# Patient Record
Sex: Male | Born: 1965 | Race: White | Hispanic: No | Smoking: Current every day smoker
Health system: Southern US, Community
[De-identification: ages and names within clinical notes are randomized; demographics above are authoritative.]

## PROBLEM LIST (undated history)

## (undated) DIAGNOSIS — I219 Acute myocardial infarction, unspecified: Secondary | ICD-10-CM

## (undated) DIAGNOSIS — Z789 Other specified health status: Secondary | ICD-10-CM

## (undated) DIAGNOSIS — L0390 Cellulitis, unspecified: Secondary | ICD-10-CM

## (undated) DIAGNOSIS — I10 Essential (primary) hypertension: Secondary | ICD-10-CM

## (undated) HISTORY — PX: CORONARY ARTERY BYPASS GRAFT: SHX141

## (undated) HISTORY — PX: APPENDECTOMY: SHX54

---

## 2004-10-08 ENCOUNTER — Emergency Department (HOSPITAL_COMMUNITY): Admission: EM | Admit: 2004-10-08 | Discharge: 2004-10-09 | Payer: Self-pay

## 2004-10-10 ENCOUNTER — Emergency Department (HOSPITAL_COMMUNITY): Admission: EM | Admit: 2004-10-10 | Discharge: 2004-10-10 | Payer: Self-pay | Admitting: Emergency Medicine

## 2004-10-13 ENCOUNTER — Emergency Department (HOSPITAL_COMMUNITY): Admission: EM | Admit: 2004-10-13 | Discharge: 2004-10-13 | Payer: Self-pay | Admitting: Emergency Medicine

## 2004-11-22 ENCOUNTER — Emergency Department (HOSPITAL_COMMUNITY): Admission: EM | Admit: 2004-11-22 | Discharge: 2004-11-22 | Payer: Self-pay | Admitting: Emergency Medicine

## 2006-06-27 ENCOUNTER — Emergency Department (HOSPITAL_COMMUNITY): Admission: EM | Admit: 2006-06-27 | Discharge: 2006-06-27 | Payer: Self-pay | Admitting: Emergency Medicine

## 2006-11-04 ENCOUNTER — Emergency Department (HOSPITAL_COMMUNITY): Admission: EM | Admit: 2006-11-04 | Discharge: 2006-11-04 | Payer: Self-pay | Admitting: Emergency Medicine

## 2007-05-29 ENCOUNTER — Emergency Department (HOSPITAL_COMMUNITY): Admission: EM | Admit: 2007-05-29 | Discharge: 2007-05-29 | Payer: Self-pay | Admitting: Emergency Medicine

## 2007-07-08 ENCOUNTER — Emergency Department (HOSPITAL_COMMUNITY): Admission: EM | Admit: 2007-07-08 | Discharge: 2007-07-08 | Payer: Self-pay | Admitting: Hematology and Oncology

## 2017-03-16 ENCOUNTER — Emergency Department (HOSPITAL_COMMUNITY): Payer: Self-pay

## 2017-03-16 ENCOUNTER — Encounter (HOSPITAL_COMMUNITY): Payer: Self-pay | Admitting: Emergency Medicine

## 2017-03-16 ENCOUNTER — Inpatient Hospital Stay (HOSPITAL_COMMUNITY)
Admission: EM | Admit: 2017-03-16 | Discharge: 2017-03-20 | DRG: 728 | Disposition: A | Discharge status: Home / Self Care | Payer: Self-pay | Attending: Internal Medicine | Admitting: Internal Medicine

## 2017-03-16 DIAGNOSIS — F17200 Nicotine dependence, unspecified, uncomplicated: Secondary | ICD-10-CM | POA: Diagnosis present

## 2017-03-16 DIAGNOSIS — N4821 Abscess of corpus cavernosum and penis: Secondary | ICD-10-CM | POA: Diagnosis present

## 2017-03-16 DIAGNOSIS — N4822 Cellulitis of corpus cavernosum and penis: Secondary | ICD-10-CM

## 2017-03-16 DIAGNOSIS — L0390 Cellulitis, unspecified: Secondary | ICD-10-CM | POA: Diagnosis present

## 2017-03-16 DIAGNOSIS — R3912 Poor urinary stream: Secondary | ICD-10-CM

## 2017-03-16 DIAGNOSIS — N433 Hydrocele, unspecified: Secondary | ICD-10-CM | POA: Diagnosis present

## 2017-03-16 DIAGNOSIS — N50819 Testicular pain, unspecified: Secondary | ICD-10-CM

## 2017-03-16 DIAGNOSIS — R531 Weakness: Secondary | ICD-10-CM

## 2017-03-16 LAB — CBC WITH DIFFERENTIAL/PLATELET
BASOS ABS: 0.1 10*3/uL (ref 0.0–0.1)
Basophils Relative: 0 %
EOS PCT: 1 %
Eosinophils Absolute: 0.2 10*3/uL (ref 0.0–0.7)
HCT: 48.2 % (ref 39.0–52.0)
HEMOGLOBIN: 16.7 g/dL (ref 13.0–17.0)
LYMPHS ABS: 2 10*3/uL (ref 0.7–4.0)
LYMPHS PCT: 14 %
MCH: 33.4 pg (ref 26.0–34.0)
MCHC: 34.6 g/dL (ref 30.0–36.0)
MCV: 96.4 fL (ref 78.0–100.0)
Monocytes Absolute: 1.4 10*3/uL — ABNORMAL HIGH (ref 0.1–1.0)
Monocytes Relative: 10 %
NEUTROS PCT: 75 %
Neutro Abs: 10.4 10*3/uL — ABNORMAL HIGH (ref 1.7–7.7)
PLATELETS: 239 10*3/uL (ref 150–400)
RBC: 5 MIL/uL (ref 4.22–5.81)
RDW: 13.9 % (ref 11.5–15.5)
WBC: 14.1 10*3/uL — AB (ref 4.0–10.5)

## 2017-03-16 LAB — URINALYSIS, ROUTINE W REFLEX MICROSCOPIC
Bacteria, UA: NONE SEEN
Bilirubin Urine: NEGATIVE
GLUCOSE, UA: NEGATIVE mg/dL
Ketones, ur: NEGATIVE mg/dL
LEUKOCYTES UA: NEGATIVE
NITRITE: NEGATIVE
PH: 6 (ref 5.0–8.0)
PROTEIN: NEGATIVE mg/dL
SPECIFIC GRAVITY, URINE: 1.003 — AB (ref 1.005–1.030)

## 2017-03-16 LAB — BASIC METABOLIC PANEL
ANION GAP: 7 (ref 5–15)
BUN: 15 mg/dL (ref 6–20)
CHLORIDE: 103 mmol/L (ref 101–111)
CO2: 25 mmol/L (ref 22–32)
Calcium: 9.5 mg/dL (ref 8.9–10.3)
Creatinine, Ser: 0.91 mg/dL (ref 0.61–1.24)
GFR calc Af Amer: >60 mL/min (ref >60)
GLUCOSE: 102 mg/dL — AB (ref 65–99)
POTASSIUM: 4 mmol/L (ref 3.5–5.1)
SODIUM: 135 mmol/L (ref 135–145)

## 2017-03-16 MED ORDER — VANCOMYCIN HCL IN DEXTROSE 1-5 GM/200ML-% IV SOLN
1000.0000 mg | Freq: Once | INTRAVENOUS | Status: AC
  Administered 2017-03-16: 1000 mg via INTRAVENOUS
  Filled 2017-03-16: qty 200

## 2017-03-16 MED ORDER — HYDROCODONE-ACETAMINOPHEN 5-325 MG PO TABS: 1.0000 | ORAL_TABLET | ORAL | Status: DC | PRN

## 2017-03-16 MED ORDER — PIPERACILLIN-TAZOBACTAM 3.375 G IVPB 30 MIN
3.3750 g | Freq: Once | INTRAVENOUS | Status: AC
  Administered 2017-03-16: 3.375 g via INTRAVENOUS
  Filled 2017-03-16: qty 50

## 2017-03-16 MED ORDER — ACETAMINOPHEN 650 MG RE SUPP: 650.0000 mg | Freq: Four times a day (QID) | RECTAL | Status: DC | PRN

## 2017-03-16 MED ORDER — ACETAMINOPHEN 325 MG PO TABS: 650.0000 mg | ORAL_TABLET | Freq: Four times a day (QID) | ORAL | Status: DC | PRN

## 2017-03-16 MED ORDER — ONDANSETRON HCL 4 MG PO TABS: 4.0000 mg | ORAL_TABLET | Freq: Four times a day (QID) | ORAL | Status: DC | PRN

## 2017-03-16 MED ORDER — KETOROLAC TROMETHAMINE 30 MG/ML IJ SOLN
30.0000 mg | Freq: Four times a day (QID) | INTRAMUSCULAR | Status: AC
  Administered 2017-03-16 – 2017-03-18 (×8): 30 mg via INTRAVENOUS
  Filled 2017-03-16 (×7): qty 1

## 2017-03-16 MED ORDER — SODIUM CHLORIDE 0.9 % IV SOLN
INTRAVENOUS | Status: DC
  Administered 2017-03-16 – 2017-03-19 (×4): via INTRAVENOUS

## 2017-03-16 MED ORDER — ONDANSETRON HCL 4 MG/2ML IJ SOLN: 4.0000 mg | Freq: Four times a day (QID) | INTRAMUSCULAR | Status: DC | PRN

## 2017-03-16 MED ORDER — HYDROCODONE-ACETAMINOPHEN 5-325 MG PO TABS
1.0000 | ORAL_TABLET | ORAL | Status: DC | PRN
  Administered 2017-03-16 – 2017-03-19 (×7): 1 via ORAL
  Filled 2017-03-16 (×7): qty 1

## 2017-03-16 MED ORDER — HYDROCODONE-ACETAMINOPHEN 5-325 MG PO TABS
1.0000 | ORAL_TABLET | Freq: Once | ORAL | Status: AC
  Administered 2017-03-16: 1 via ORAL
  Filled 2017-03-16: qty 1

## 2017-03-16 MED ORDER — PIPERACILLIN-TAZOBACTAM 3.375 G IVPB 30 MIN: 3.3750 g | Freq: Once | INTRAVENOUS | Status: DC

## 2017-03-16 MED ORDER — PIPERACILLIN-TAZOBACTAM 3.375 G IVPB
3.3750 g | Freq: Three times a day (TID) | INTRAVENOUS | Status: DC
  Administered 2017-03-16 – 2017-03-20 (×11): 3.375 g via INTRAVENOUS
  Filled 2017-03-16 (×11): qty 50

## 2017-03-16 MED ORDER — PIPERACILLIN-TAZOBACTAM IN DEX 2-0.25 GM/50ML IV SOLN: 2.2500 g | Freq: Once | INTRAVENOUS | Status: DC

## 2017-03-16 MED ORDER — HEPARIN SODIUM (PORCINE) 5000 UNIT/ML IJ SOLN
5000.0000 [IU] | Freq: Three times a day (TID) | INTRAMUSCULAR | Status: DC
  Administered 2017-03-16 – 2017-03-19 (×8): 5000 [IU] via SUBCUTANEOUS
  Filled 2017-03-16 (×10): qty 1

## 2017-03-16 MED ORDER — VANCOMYCIN HCL 10 G IV SOLR
1250.0000 mg | Freq: Two times a day (BID) | INTRAVENOUS | Status: DC
  Administered 2017-03-17 – 2017-03-20 (×7): 1250 mg via INTRAVENOUS
  Filled 2017-03-16 (×13): qty 1250

## 2017-03-16 MED ORDER — NICOTINE 21 MG/24HR TD PT24
21.0000 mg | MEDICATED_PATCH | Freq: Every day | TRANSDERMAL | Status: DC
  Administered 2017-03-16 – 2017-03-19 (×4): 21 mg via TRANSDERMAL
  Filled 2017-03-16 (×4): qty 1

## 2017-03-16 MED ORDER — ZOLPIDEM TARTRATE 5 MG PO TABS
10.0000 mg | ORAL_TABLET | Freq: Every evening | ORAL | Status: DC | PRN
  Administered 2017-03-16 – 2017-03-19 (×3): 10 mg via ORAL
  Filled 2017-03-16 (×4): qty 2

## 2017-03-16 NOTE — ED Triage Notes (Signed)
Pt reports left groin pain ever since helping a friend move furniture out of garage. Pt reports is still able to void. Pt reports testicle swelling and redness. nad noted. Pt denies any fevers.

## 2017-03-16 NOTE — Progress Notes (Signed)
Pharmacy Antibiotic Note  Charles Abbott is a 51 y.o. male admitted on 03/16/2017 with cellulitis.  Pharmacy has been consulted for vancomycin and  dosing. Vancomycin 2gm total ordered and zosyn 3.375 gm given  Plan: Zosyn 3.375g IV q8h (4 hour infusion).  Vancomycin 1250 mg IV q12 hours f/u renal function, cultures and clinical course  Height: 6\' 2"  (188 cm) Weight: 205 lb (93 kg) IBW/kg (Calculated) : 82.2  Temp (24hrs), Avg:98.2 F (36.8 C), Min:97.7 F (36.5 C), Max:98.6 F (37 C)   Recent Labs Lab 03/16/17 1327  WBC 14.1*  CREATININE 0.91    Estimated Creatinine Clearance: 112.9 mL/min (by C-G formula based on SCr of 0.91 mg/dL).    No Known Allergies  Antimicrobials this admission: vanc 3/26 >>  zosyn 3/26 >>    Microbiology results: 3/26 UCx: pending    Thank you for allowing pharmacy to be a part of this patient's care.  Woodfin GanjaSeay, Graylyn Bunney Poteet 03/16/2017 6:52 PM

## 2017-03-16 NOTE — Consult Note (Signed)
Subjective: I was asked to see Mr. Lober in consultation by Dr. Kerry Hough for penile cellulitis.   Mr. 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  He has been doing self dilation but about 2 weeks ago lost his dilator.  He is a little circumspect about the situation and I am not sure if he had a traumatic dilation.  He has had some progressive difficulty voiding and over the last few days has developed progressive painful penile swelling and came to the ER today.  A penile and scrotal US demonstrated findings consistent with penile cellulitis but a phlegmon could not be ruled out.   He has been voiding but the stream is slow.  He has had no fever.  He has no other GU history.   ROS:  Review of Systems  All other systems reviewed and are negative.   No Known Allergies  History reviewed. No pertinent past medical history.  Past Surgical History:  Procedure Laterality Date  . APPENDECTOMY      Social History   Social History  . Marital status: Divorced    Spouse name: N/A  . Number of children: N/A  . Years of education: N/A   Occupational History  . Not on file.   Social History Main Topics  . Smoking status: Current Every Day Smoker    Packs/day: 1.00  . Smokeless tobacco: Never Used  . Alcohol use No  . Drug use: No  . Sexual activity: Not on file   Other Topics Concern  . Not on file   Social History Narrative  . No narrative on file    History reviewed. No pertinent family history.  Anti-infectives: Anti-infectives    Start     Dose/Rate Route Frequency Ordered Stop   03/17/17 0500  vancomycin (VANCOCIN) 1,250 mg in sodium chloride 0.9 % 250 mL IVPB     1,250 mg 166.7 mL/hr over 90 Minutes Intravenous Every 12 hours 03/16/17 1851     03/16/17 2100  piperacillin-tazobactam (ZOSYN) IVPB 3.375 g     3.375 g 12.5 mL/hr over 240 Minutes Intravenous Every 8 hours 03/16/17 1851      03/16/17 1845  piperacillin-tazobactam (ZOSYN) IVPB 3.375 g  Status:  Discontinued     3.375 g 100 mL/hr over 30 Minutes Intravenous  Once 03/16/17 1841 03/16/17 1849   03/16/17 1845  vancomycin (VANCOCIN) IVPB 1000 mg/200 mL premix     1,000 mg 200 mL/hr over 60 Minutes Intravenous  Once 03/16/17 1841 03/16/17 2018   03/16/17 1500  piperacillin-tazobactam (ZOSYN) IVPB 3.375 g     3.375 g 100 mL/hr over 30 Minutes Intravenous  Once 03/16/17 1446 03/16/17 1533   03/16/17 1445  vancomycin (VANCOCIN) IVPB 1000 mg/200 mL premix     1,000 mg 200 mL/hr over 60 Minutes Intravenous  Once 03/16/17 1441 03/16/17 1604   03/16/17 1445  piperacillin-tazobactam (ZOSYN) IVPB 2.25 g  Status:  Discontinued     2.25 g 100 mL/hr over 30 Minutes Intravenous  Once 03/16/17 1441 03/16/17 1446      Current Facility-Administered Medications  Medication Dose Route Frequency Provider Last Rate Last Dose  . 0.9 %  sodium chloride infusion   Intravenous Continuous Erick Blinks, MD 75 mL/hr at 03/16/17 1855    . acetaminophen (TYLENOL) tablet 650 mg  650 mg Oral Q6H PRN Erick Blinks, MD  Or  . acetaminophen (TYLENOL) suppository 650 mg  650 mg Rectal Q6H PRN Erick Blinks, MD      . heparin injection 5,000 Units  5,000 Units Subcutaneous Q8H Erick Blinks, MD   5,000 Units at 03/16/17 2048  . HYDROcodone-acetaminophen (NORCO/VICODIN) 5-325 MG per tablet 1 tablet  1 tablet Oral Q4H PRN Erick Blinks, MD   1 tablet at 03/16/17 2102  . ketorolac (TORADOL) 30 MG/ML injection 30 mg  30 mg Intravenous Q6H Erick Blinks, MD   30 mg at 03/16/17 1855  . ondansetron (ZOFRAN) tablet 4 mg  4 mg Oral Q6H PRN Erick Blinks, MD       Or  . ondansetron (ZOFRAN) injection 4 mg  4 mg Intravenous Q6H PRN Erick Blinks, MD      . piperacillin-tazobactam (ZOSYN) IVPB 3.375 g  3.375 g Intravenous Q8H Erick Blinks, MD   3.375 g at 03/16/17 2048  . [START ON 03/17/2017] vancomycin (VANCOCIN) 1,250 mg in sodium  chloride 0.9 % 250 mL IVPB  1,250 mg Intravenous Q12H Erick Blinks, MD         Objective: Vital signs in last 24 hours: Temp:  [97.7 F (36.5 C)-98.6 F (37 C)] 98.6 F (37 C) (03/26 1844) Pulse Rate:  [79-104] 93 (03/26 1844) Resp:  [18] 18 (03/26 1517) BP: (95-131)/(62-84) 118/73 (03/26 1844) SpO2:  [95 %-100 %] 100 % (03/26 1844) Weight:  [93 kg (205 lb)] 93 kg (205 lb) (03/26 1057)  Intake/Output from previous day: No intake/output data recorded. Intake/Output this shift: No intake/output data recorded.   Physical Exam  Constitutional: He is oriented to person, place, and time and well-developed, well-nourished, and in no distress.  HENT:  Head: Normocephalic and atraumatic.  Neck: Normal range of motion. Neck supple.  Cardiovascular: Normal rate, regular rhythm and normal heart sounds.   Pulmonary/Chest: Effort normal and breath sounds normal. No respiratory distress.  Abdominal: Soft. He exhibits no distension and no mass. There is no tenderness.  No hernia  Genitourinary:  Genitourinary Comments: He has a circumcised penis with an adequate meatus but there is marked edema with mild erythema of the distal 2/3 of the penile shaft.  There is tenderness but no induration or area of fluctuance amenable to drainage.  Scrotum is normal with normal testes and epididymii.    Musculoskeletal: Normal range of motion. He exhibits no edema or tenderness.  Lymphadenopathy:    He has no cervical adenopathy.       Right: No inguinal adenopathy present.       Left: No inguinal adenopathy present.  No inguinal or femoral adenopathy is noted.   Neurological: He is alert and oriented to person, place, and time.  Skin: Skin is warm and dry. There is erythema (on the penile skin with erythema. ).  Psychiatric: Mood and affect normal.    Lab Results:   Recent Labs  03/16/17 1327  WBC 14.1*  HGB 16.7  HCT 48.2  PLT 239   BMET  Recent Labs  03/16/17 1327  NA 135  K 4.0   CL 103  CO2 25  GLUCOSE 102*  BUN 15  CREATININE 0.91  CALCIUM 9.5   PT/INR No results for input(s): LABPROT, INR in the last 72 hours. ABG No results for input(s): PHART, HCO3 in the last 72 hours.  Invalid input(s): PCO2, PO2  Studies/Results: US Pelvis Limited  Result Date: 03/16/2017 CLINICAL DATA:  Groin pain. Swelling of the penis. The patient has been self catheterizing to  dilate the urethra, reportedly in a non sterile fashion. EXAM: SCROTAL ULTRASOUND DOPPLER ULTRASOUND OF THE TESTICLES PENILE ULTRASOUND TECHNIQUE: Complete ultrasound examination of the testicles, epididymis, and other scrotal structures was performed. Color and spectral Doppler ultrasound were also utilized to evaluate blood flow to the testicles. Sonographic assessment of the penis was performed. COMPARISON:  None. FINDINGS: Right testicle Measurements: 4.9 by 2.4 by 2.6 cm (volume = 16 cm^3). No mass or microlithiasis visualized. Left testicle Measurements: 3.8 by 2.0 by 2.9 cm (volume = 12 cm^3). No mass or microlithiasis visualized. Right epididymis:  Normal in size and appearance. Left epididymis:  Normal in size and appearance. Hydrocele:  Small right hydrocele Varicocele:  None visualized. Pulsed Doppler interrogation of both testes demonstrates normal low resistance arterial and venous waveforms bilaterally. Penis: The corpora cavernosa appear unremarkable. Slight echogenicity of the corpus spongiosum/urethra noted. Expanded subcutaneous tissues are observed with some accentuated hypoechoic regions particularly along the left ventral penis, with internal vascular flow. There some linear areas of echogenicity with surrounding hypoechogenicity for example on image 62 and image 56 within the subcutaneous tissues. The did not observe any of these hyper echogenicities 2 shadow. No obvious discontinuity in the tunica albuginea to suggest a penile fracture. IMPRESSION: 1. Abnormal expansion of the subcutaneous tissues  of the penis, slightly asymmetric and especially towards the left side but otherwise circumferential. The underlying inflammatory phlegmon is a possibility. Reportedly the patient has been self catheterizing the urethra, and the possibility of a urethral tear with hematoma or infection spreading into the subcutaneous tissues at the penis is not excluded given the current abnormal appearance. I do not see a shadowing abnormality to suggest a typical foreign body in the subcutaneous tissues. The corpora cavernosa appear relatively unremarkable and a Doppler imaging we did not see an obvious vascular abnormality along the corpora. 2. Slightly irregular appearance of the corpus spongiosum/urethra but without an obviously defined tear. Clearly other exams are more suited to assessing for urethral tears. 3. There is some mild asymmetry of testicular size with the right side larger than the left, along with a small right hydrocele, but no other compelling findings of orchitis or epididymitis. Electronically Signed   By: Gaylyn Rong M.D.   On: 03/16/2017 13:35   US Scrotum  Result Date: 03/16/2017 CLINICAL DATA:  Groin pain. Swelling of the penis. The patient has been self catheterizing to dilate the urethra, reportedly in a non sterile fashion. EXAM: SCROTAL ULTRASOUND DOPPLER ULTRASOUND OF THE TESTICLES PENILE ULTRASOUND TECHNIQUE: Complete ultrasound examination of the testicles, epididymis, and other scrotal structures was performed. Color and spectral Doppler ultrasound were also utilized to evaluate blood flow to the testicles. Sonographic assessment of the penis was performed. COMPARISON:  None. FINDINGS: Right testicle Measurements: 4.9 by 2.4 by 2.6 cm (volume = 16 cm^3). No mass or microlithiasis visualized. Left testicle Measurements: 3.8 by 2.0 by 2.9 cm (volume = 12 cm^3). No mass or microlithiasis visualized. Right epididymis:  Normal in size and appearance. Left epididymis:  Normal in size and  appearance. Hydrocele:  Small right hydrocele Varicocele:  None visualized. Pulsed Doppler interrogation of both testes demonstrates normal low resistance arterial and venous waveforms bilaterally. Penis: The corpora cavernosa appear unremarkable. Slight echogenicity of the corpus spongiosum/urethra noted. Expanded subcutaneous tissues are observed with some accentuated hypoechoic regions particularly along the left ventral penis, with internal vascular flow. There some linear areas of echogenicity with surrounding hypoechogenicity for example on image 62 and image 56 within the subcutaneous  tissues. The did not observe any of these hyper echogenicities 2 shadow. No obvious discontinuity in the tunica albuginea to suggest a penile fracture. IMPRESSION: 1. Abnormal expansion of the subcutaneous tissues of the penis, slightly asymmetric and especially towards the left side but otherwise circumferential. The underlying inflammatory phlegmon is a possibility. Reportedly the patient has been self catheterizing the urethra, and the possibility of a urethral tear with hematoma or infection spreading into the subcutaneous tissues at the penis is not excluded given the current abnormal appearance. I do not see a shadowing abnormality to suggest a typical foreign body in the subcutaneous tissues. The corpora cavernosa appear relatively unremarkable and a Doppler imaging we did not see an obvious vascular abnormality along the corpora. 2. Slightly irregular appearance of the corpus spongiosum/urethra but without an obviously defined tear. Clearly other exams are more suited to assessing for urethral tears. 3. There is some mild asymmetry of testicular size with the right side larger than the left, along with a small right hydrocele, but no other compelling findings of orchitis or epididymitis. Electronically Signed   By: Gaylyn RongWalter  Liebkemann M.D.   On: 03/16/2017 13:35   [place] Art/ven Flow Abd Pelv Doppler  Result Date:  03/16/2017 CLINICAL DATA:  Groin pain. Swelling of the penis. The patient has been self catheterizing to dilate the urethra, reportedly in a non sterile fashion. EXAM: SCROTAL ULTRASOUND DOPPLER ULTRASOUND OF THE TESTICLES PENILE ULTRASOUND TECHNIQUE: Complete ultrasound examination of the testicles, epididymis, and other scrotal structures was performed. Color and spectral Doppler ultrasound were also utilized to evaluate blood flow to the testicles. Sonographic assessment of the penis was performed. COMPARISON:  None. FINDINGS: Right testicle Measurements: 4.9 by 2.4 by 2.6 cm (volume = 16 cm^3). No mass or microlithiasis visualized. Left testicle Measurements: 3.8 by 2.0 by 2.9 cm (volume = 12 cm^3). No mass or microlithiasis visualized. Right epididymis:  Normal in size and appearance. Left epididymis:  Normal in size and appearance. Hydrocele:  Small right hydrocele Varicocele:  None visualized. Pulsed Doppler interrogation of both testes demonstrates normal low resistance arterial and venous waveforms bilaterally. Penis: The corpora cavernosa appear unremarkable. Slight echogenicity of the corpus spongiosum/urethra noted. Expanded subcutaneous tissues are observed with some accentuated hypoechoic regions particularly along the left ventral penis, with internal vascular flow. There some linear areas of echogenicity with surrounding hypoechogenicity for example on image 62 and image 56 within the subcutaneous tissues. The did not observe any of these hyper echogenicities 2 shadow. No obvious discontinuity in the tunica albuginea to suggest a penile fracture. IMPRESSION: 1. Abnormal expansion of the subcutaneous tissues of the penis, slightly asymmetric and especially towards the left side but otherwise circumferential. The underlying inflammatory phlegmon is a possibility. Reportedly the patient has been self catheterizing the urethra, and the possibility of a urethral tear with hematoma or infection spreading  into the subcutaneous tissues at the penis is not excluded given the current abnormal appearance. I do not see a shadowing abnormality to suggest a typical foreign body in the subcutaneous tissues. The corpora cavernosa appear relatively unremarkable and a Doppler imaging we did not see an obvious vascular abnormality along the corpora. 2. Slightly irregular appearance of the corpus spongiosum/urethra but without an obviously defined tear. Clearly other exams are more suited to assessing for urethral tears. 3. There is some mild asymmetry of testicular size with the right side larger than the left, along with a small right hydrocele, but no other compelling findings of orchitis or epididymitis.  Electronically Signed   By: Gaylyn Rong M.D.   On: 03/16/2017 13:35   I have reviewed the pertinent films and report along with his labs.   I discussed the case with the EDP.    I elected to place a foley in case there is a urethral injury.   He was prepped with betadine and the urethra was instilled with lubricant.  A 59fr foley passed with minimal resistance.  The catheter was placed to bedside bag drainage.    Assessment: Penile cellulitis with history of urethral stricture.    I placed a foley in case there is a urethral injury.   I don't feel a drainable collection at this time.   Continue current antitbiotics.   I will make him NPO after MN, should an abscess become more apparent.  A retrograde urethrogram may be of value if he has persistent swelling and inflammation.       CC: Dr. Erick Blinks.      Hope Brandenburger J 03/16/2017 [phone]

## 2017-03-16 NOTE — ED Provider Notes (Signed)
[facility]-EMERGENCY DEPT Provider Note   CSN: [id number] Arrival date & time: 03/16/17  1046  By signing my name below, I, Cynda Acres, attest that this documentation has been prepared under the direction and in the presence of Burgess Amor, PA-C. Electronically Signed: Cynda Acres, Scribe. 03/16/17. 11:56 AM.  History   Chief Complaint Chief Complaint  Patient presents with  . Groin Pain   HPI Comments: Charles Abbott is a 51 y.o. male with no pertinent medical history, who presents to the Emergency Department complaining of sudden-onset, groin pain that began 4 days ago. Patient states he was helping a friend move furniture out of the garage, when he developed pain to the left groin. Patient reports having a "knot" that went away and reappeared in a different area. Patient states he [facility] and underwent a distal urethral dilitation procedure and has since has had  to "self-dilate" 2-3 times a day per the urologists instruction. Patient states he has been dilating once a day. Patient also states he has been using a nonspecific solution to sterilize the dilation pin but has not performed this procedure in 2 weeks as he lost the dilator.   Patient reports associated pain, itching, penile swelling, difficulty urinating, redness, and lower back pain. His urine stream has become flattened and comes out as a spray since he has been unable to dilate.  Patient reports taking ibuprofen with no relief. Patient denies any trauma, dysuria, numbness, hematuria, penile discharge, or fever.  The history is provided by the patient. No language interpreter was used.    History reviewed. No pertinent past medical history.  Patient Active Problem List   Diagnosis Date Noted  . Cellulitis 03/16/2017    History reviewed. No pertinent surgical history.     Home Medications    Prior to Admission medications   Not on File    Family History History reviewed. No pertinent family  history.  Social History Social History  Substance Use Topics  . Smoking status: Current Every Day Smoker    Packs/day: 1.00  . Smokeless tobacco: Never Used  . Alcohol use No     Allergies   Patient has no allergy information on record.   Review of Systems Review of Systems  Genitourinary: Positive for difficulty urinating, penile pain and penile swelling. Negative for discharge, dysuria and hematuria.  Musculoskeletal: Positive for back pain (lower ).  Skin: Positive for color change (Penile erythema).  Neurological: Negative for numbness.  All other systems reviewed and are negative.    Physical Exam Updated Vital Signs BP 95/62   Pulse 79   Temp 97.7 F (36.5 C) (Oral)   Resp 18   Ht 6\' 2"  (1.88 m)   Wt 93 kg   SpO2 97%   BMI 26.32 kg/m   Physical Exam  Constitutional: He is oriented to person, place, and time. He appears well-developed.  HENT:  Head: Normocephalic and atraumatic.  Mouth/Throat: Oropharynx is clear and moist.  Eyes: Conjunctivae and EOM are normal. Pupils are equal, round, and reactive to light.  Neck: Normal range of motion. Neck supple.  Cardiovascular: Normal rate and regular rhythm.   Pulmonary/Chest: Effort normal and breath sounds normal.  Abdominal:  No CVA tenderness.   Genitourinary: Penile tenderness present.  Genitourinary Comments: Patient has significant edema and erythema along the entirety of his penis. No scrotal involvement. There appears to be some friction induced edema at the glands (boggy appearing edema). No penile discharge.  There appears to be some scarring at his distal urethra.   Musculoskeletal: Normal range of motion. He exhibits tenderness. He exhibits no edema or deformity.  Tender at the right lower back over the pelvic rim.   Neurological: He is alert and oriented to person, place, and time.  Skin: Skin is warm and dry.  Psychiatric: He has a normal mood and affect.  Nursing note and vitals  reviewed.    ED Treatments / Results  DIAGNOSTIC STUDIES: Oxygen Saturation is 97% on RA, normal by my interpretation.    COORDINATION OF CARE: 11:56 AM Discussed treatment plan with pt at bedside and pt agreed to plan, which includes IV vancomycin, IV zosyn, urology consultation, and an admission.   Labs (all labs ordered are listed, but only abnormal results are displayed) Labs Reviewed  URINALYSIS, ROUTINE W REFLEX MICROSCOPIC - Abnormal; Notable for the following:       Result Value   Color, Urine STRAW (*)    Specific Gravity, Urine 1.003 (*)    Hgb urine dipstick MODERATE (*)    Squamous Epithelial / LPF 0-5 (*)    All other components within normal limits  CBC WITH DIFFERENTIAL/PLATELET - Abnormal; Notable for the following:    WBC 14.1 (*)    Neutro Abs 10.4 (*)    Monocytes Absolute 1.4 (*)    All other components within normal limits  BASIC METABOLIC PANEL - Abnormal; Notable for the following:    Glucose, Bld 102 (*)    All other components within normal limits  URINE CULTURE  GC/CHLAMYDIA PROBE AMP ([place]) NOT AT [facility]    EKG  EKG Interpretation None       Radiology US Pelvis Limited  Result Date: 03/16/2017 CLINICAL DATA:  Groin pain. Swelling of the penis. The patient has been self catheterizing to dilate the urethra, reportedly in a non sterile fashion. EXAM: SCROTAL ULTRASOUND DOPPLER ULTRASOUND OF THE TESTICLES PENILE ULTRASOUND TECHNIQUE: Complete ultrasound examination of the testicles, epididymis, and other scrotal structures was performed. Color and spectral Doppler ultrasound were also utilized to evaluate blood flow to the testicles. Sonographic assessment of the penis was performed. COMPARISON:  None. FINDINGS: Right testicle Measurements: 4.9 by 2.4 by 2.6 cm (volume = 16 cm^3). No mass or microlithiasis visualized. Left testicle Measurements: 3.8 by 2.0 by 2.9 cm (volume = 12 cm^3). No mass or microlithiasis visualized. Right epididymis:   Normal in size and appearance. Left epididymis:  Normal in size and appearance. Hydrocele:  Small right hydrocele Varicocele:  None visualized. Pulsed Doppler interrogation of both testes demonstrates normal low resistance arterial and venous waveforms bilaterally. Penis: The corpora cavernosa appear unremarkable. Slight echogenicity of the corpus spongiosum/urethra noted. Expanded subcutaneous tissues are observed with some accentuated hypoechoic regions particularly along the left ventral penis, with internal vascular flow. There some linear areas of echogenicity with surrounding hypoechogenicity for example on image 62 and image 56 within the subcutaneous tissues. The did not observe any of these hyper echogenicities 2 shadow. No obvious discontinuity in the tunica albuginea to suggest a penile fracture. IMPRESSION: 1. Abnormal expansion of the subcutaneous tissues of the penis, slightly asymmetric and especially towards the left side but otherwise circumferential. The underlying inflammatory phlegmon is a possibility. Reportedly the patient has been self catheterizing the urethra, and the possibility of a urethral tear with hematoma or infection spreading into the subcutaneous tissues at the penis is not excluded given the current abnormal appearance. I do not see a shadowing abnormality  to suggest a typical foreign body in the subcutaneous tissues. The corpora cavernosa appear relatively unremarkable and a Doppler imaging we did not see an obvious vascular abnormality along the corpora. 2. Slightly irregular appearance of the corpus spongiosum/urethra but without an obviously defined tear. Clearly other exams are more suited to assessing for urethral tears. 3. There is some mild asymmetry of testicular size with the right side larger than the left, along with a small right hydrocele, but no other compelling findings of orchitis or epididymitis. Electronically Signed   By: Gaylyn RongWalter  Liebkemann M.D.   On:  03/16/2017 13:35   [place] Scrotum  Result Date: 03/16/2017 CLINICAL DATA:  Groin pain. Swelling of the penis. The patient has been self catheterizing to dilate the urethra, reportedly in a non sterile fashion. EXAM: SCROTAL ULTRASOUND DOPPLER ULTRASOUND OF THE TESTICLES PENILE ULTRASOUND TECHNIQUE: Complete ultrasound examination of the testicles, epididymis, and other scrotal structures was performed. Color and spectral Doppler ultrasound were also utilized to evaluate blood flow to the testicles. Sonographic assessment of the penis was performed. COMPARISON:  None. FINDINGS: Right testicle Measurements: 4.9 by 2.4 by 2.6 cm (volume = 16 cm^3). No mass or microlithiasis visualized. Left testicle Measurements: 3.8 by 2.0 by 2.9 cm (volume = 12 cm^3). No mass or microlithiasis visualized. Right epididymis:  Normal in size and appearance. Left epididymis:  Normal in size and appearance. Hydrocele:  Small right hydrocele Varicocele:  None visualized. Pulsed Doppler interrogation of both testes demonstrates normal low resistance arterial and venous waveforms bilaterally. Penis: The corpora cavernosa appear unremarkable. Slight echogenicity of the corpus spongiosum/urethra noted. Expanded subcutaneous tissues are observed with some accentuated hypoechoic regions particularly along the left ventral penis, with internal vascular flow. There some linear areas of echogenicity with surrounding hypoechogenicity for example on image 62 and image 56 within the subcutaneous tissues. The did not observe any of these hyper echogenicities 2 shadow. No obvious discontinuity in the tunica albuginea to suggest a penile fracture. IMPRESSION: 1. Abnormal expansion of the subcutaneous tissues of the penis, slightly asymmetric and especially towards the left side but otherwise circumferential. The underlying inflammatory phlegmon is a possibility. Reportedly the patient has been self catheterizing the urethra, and the possibility of a  urethral tear with hematoma or infection spreading into the subcutaneous tissues at the penis is not excluded given the current abnormal appearance. I do not see a shadowing abnormality to suggest a typical foreign body in the subcutaneous tissues. The corpora cavernosa appear relatively unremarkable and a Doppler imaging we did not see an obvious vascular abnormality along the corpora. 2. Slightly irregular appearance of the corpus spongiosum/urethra but without an obviously defined tear. Clearly other exams are more suited to assessing for urethral tears. 3. There is some mild asymmetry of testicular size with the right side larger than the left, along with a small right hydrocele, but no other compelling findings of orchitis or epididymitis. Electronically Signed   By: Gaylyn RongWalter  Liebkemann M.D.   On: 03/16/2017 13:35   [place] Art/ven Flow Abd Pelv Doppler  Result Date: 03/16/2017 CLINICAL DATA:  Groin pain. Swelling of the penis. The patient has been self catheterizing to dilate the urethra, reportedly in a non sterile fashion. EXAM: SCROTAL ULTRASOUND DOPPLER ULTRASOUND OF THE TESTICLES PENILE ULTRASOUND TECHNIQUE: Complete ultrasound examination of the testicles, epididymis, and other scrotal structures was performed. Color and spectral Doppler ultrasound were also utilized to evaluate blood flow to the testicles. Sonographic assessment of the penis was performed. COMPARISON:  None.  FINDINGS: Right testicle Measurements: 4.9 by 2.4 by 2.6 cm (volume = 16 cm^3). No mass or microlithiasis visualized. Left testicle Measurements: 3.8 by 2.0 by 2.9 cm (volume = 12 cm^3). No mass or microlithiasis visualized. Right epididymis:  Normal in size and appearance. Left epididymis:  Normal in size and appearance. Hydrocele:  Small right hydrocele Varicocele:  None visualized. Pulsed Doppler interrogation of both testes demonstrates normal low resistance arterial and venous waveforms bilaterally. Penis: The corpora cavernosa  appear unremarkable. Slight echogenicity of the corpus spongiosum/urethra noted. Expanded subcutaneous tissues are observed with some accentuated hypoechoic regions particularly along the left ventral penis, with internal vascular flow. There some linear areas of echogenicity with surrounding hypoechogenicity for example on image 62 and image 56 within the subcutaneous tissues. The did not observe any of these hyper echogenicities 2 shadow. No obvious discontinuity in the tunica albuginea to suggest a penile fracture. IMPRESSION: 1. Abnormal expansion of the subcutaneous tissues of the penis, slightly asymmetric and especially towards the left side but otherwise circumferential. The underlying inflammatory phlegmon is a possibility. Reportedly the patient has been self catheterizing the urethra, and the possibility of a urethral tear with hematoma or infection spreading into the subcutaneous tissues at the penis is not excluded given the current abnormal appearance. I do not see a shadowing abnormality to suggest a typical foreign body in the subcutaneous tissues. The corpora cavernosa appear relatively unremarkable and a Doppler imaging we did not see an obvious vascular abnormality along the corpora. 2. Slightly irregular appearance of the corpus spongiosum/urethra but without an obviously defined tear. Clearly other exams are more suited to assessing for urethral tears. 3. There is some mild asymmetry of testicular size with the right side larger than the left, along with a small right hydrocele, but no other compelling findings of orchitis or epididymitis. Electronically Signed   By: Gaylyn Rong M.D.   On: 03/16/2017 13:35    Procedures Procedures (including critical care time)  Medications Ordered in ED Medications  HYDROcodone-acetaminophen (NORCO/VICODIN) 5-325 MG per tablet 1 tablet (1 tablet Oral Given 03/16/17 1208)  vancomycin (VANCOCIN) IVPB 1000 mg/200 mL premix (0 mg Intravenous Stopped  03/16/17 1604)  piperacillin-tazobactam (ZOSYN) IVPB 3.375 g (0 g Intravenous Stopped 03/16/17 1533)     Initial Impression / Assessment and Plan / ED Course  I have reviewed the triage vital signs and the nursing notes.  Pertinent labs & imaging results that were available during my care of the patient were reviewed by me and considered in my medical decision making (see chart for details).     2:42 PM Spoke with Dr. Annabell Howells, urologist on call who recommended IV vancomycin and zosyn and he will consult pt at [facility].  Requests medical admission.  Call placed to Dr. Kerry Hough who will admit pt.    Final Clinical Impressions(s) / ED Diagnoses   Final diagnoses:  Testicular pain  Cellulitis of penis    New Prescriptions New Prescriptions   No medications on file   I personally performed the services described in this documentation, which was scribed in my presence. The recorded information has been reviewed and is accurate.     Burgess Amor, PA-C 03/16/17 1716    Samuel Jester, DO 03/20/17 [phone]

## 2017-03-16 NOTE — H&P (Signed)
History and Physical    Charles Abbott ZOX:[id number] DOB: 07/06/1966 DOA: 03/16/2017  PCP: No PCP Per Patient  Patient coming from: home  I have personally briefly reviewed patient's [facility]  Chief Complaint: home  HPI: Charles Levyony L Pott is a 51 y.o. male with medical history significant of appendectomy approximately 5 years ago where he was hospitalized and reports having a prolonged Foley catheter. He reports that once catheter had been removed, he had difficulty passing his urine resulting in spraying of urine. He had seen a urologist at that time and reportedly underwent dilation of his urethra. He was told by urologist that he would need to perform regular urethral dilatations at home. Patient reports he had been doing this somewhat frequently, but has not done this in approximately 2-3 weeks since he has lost his dilator. He reports that approximately a week ago, he was helping a friend move and when lifting something heavy and noticed a "bump" appearing his groin. This subsequently resolved, but he then developed penile swelling and erythema. He has throbbing pain in his penis. Does not report any discharge that he is aware of. He does not report any fevers. No nausea, vomiting, diarrhea, shortness of breath or any other symptoms. He is otherwise in his usual state of health. Since swelling and erythema were not improving despite taking ibuprofen, he came to the ER for evaluation  ED Course: He was evaluated in the ED and felt that a possible penile cellulitis. Urology was consulted who recommended admission for IV antibiotics.  Review of Systems: As per HPI otherwise 10 point review of systems negative.    History reviewed. No pertinent past medical history.  Past Surgical History:  Procedure Laterality Date  . APPENDECTOMY       reports that he has been smoking.  He has been smoking about 1.00 pack per day. He has never used smokeless tobacco. He reports  that he does not drink alcohol or use drugs.  No Known Allergies  Family history: Family history reviewed and not pertinent  Prior to Admission medications   Not on File    Physical Exam: Vitals:   03/16/17 1600 03/16/17 1615 03/16/17 1630 03/16/17 1844  BP: 109/75  95/62 118/73  Pulse: 90 82 79 93  Resp:      Temp:    98.6 F (37 C)  TempSrc:    Oral  SpO2: 97% 95% 97% 100%  Weight:      Height:        Constitutional: NAD, calm, comfortable Vitals:   03/16/17 1600 03/16/17 1615 03/16/17 1630 03/16/17 1844  BP: 109/75  95/62 118/73  Pulse: 90 82 79 93  Resp:      Temp:    98.6 F (37 C)  TempSrc:    Oral  SpO2: 97% 95% 97% 100%  Weight:      Height:       Eyes: PERRL, lids and conjunctivae normal ENMT: Mucous membranes are moist. Posterior pharynx clear of any exudate or lesions.Normal dentition.  Neck: normal, supple, no masses, no thyromegaly Respiratory: clear to auscultation bilaterally, no wheezing, no crackles. Normal respiratory effort. No accessory muscle use.  Cardiovascular: Regular rate and rhythm, no murmurs / rubs / gallops. No extremity edema. 2+ pedal pulses. No carotid bruits.  Abdomen: no tenderness, no masses palpated. No hepatosplenomegaly. Bowel sounds positive.  Musculoskeletal: no clubbing / cyanosis. No joint deformity upper and lower extremities. Good ROM, no contractures. Normal  muscle tone.  Genitourinary: glans of penis is edematous and erythematous. Shaft also edematous. No significant swelling in testicles.  Skin: no rashes, lesions, ulcers. No induration Neurologic: CN 2-12 grossly intact. Sensation intact, DTR normal. Strength 5/5 in all 4.  Psychiatric: Normal judgment and insight. Alert and oriented x 3. Normal mood.    Labs on Admission: I have personally reviewed following labs and imaging studies  CBC:  Recent Labs Lab 03/16/17 1327  WBC 14.1*  NEUTROABS 10.4*  HGB 16.7  HCT 48.2  MCV 96.4  PLT 239   Basic Metabolic  Panel:  Recent Labs Lab 03/16/17 1327  NA 135  K 4.0  CL 103  CO2 25  GLUCOSE 102*  BUN 15  CREATININE 0.91  CALCIUM 9.5   GFR: Estimated Creatinine Clearance: 112.9 mL/min (by C-G formula based on SCr of 0.91 mg/dL). Liver Function Tests: No results for input(s): AST, ALT, ALKPHOS, BILITOT, PROT, ALBUMIN in the last 168 hours. No results for input(s): LIPASE, AMYLASE in the last 168 hours. No results for input(s): AMMONIA in the last 168 hours. Coagulation Profile: No results for input(s): INR, PROTIME in the last 168 hours. Cardiac Enzymes: No results for input(s): CKTOTAL, CKMB, CKMBINDEX, TROPONINI in the last 168 hours. BNP (last 3 results) No results for input(s): PROBNP in the last 8760 hours. HbA1C: No results for input(s): HGBA1C in the last 72 hours. CBG: No results for input(s): GLUCAP in the last 168 hours. Lipid Profile: No results for input(s): CHOL, HDL, LDLCALC, TRIG, CHOLHDL, LDLDIRECT in the last 72 hours. Thyroid Function Tests: No results for input(s): TSH, T4TOTAL, FREET4, T3FREE, THYROIDAB in the last 72 hours. Anemia Panel: No results for input(s): VITAMINB12, FOLATE, FERRITIN, TIBC, IRON, RETICCTPCT in the last 72 hours. Urine analysis:    Component Value Date/Time   COLORURINE STRAW (A) 03/16/2017 1108   APPEARANCEUR CLEAR 03/16/2017 1108   LABSPEC 1.003 (L) 03/16/2017 1108   PHURINE 6.0 03/16/2017 1108   GLUCOSEU NEGATIVE 03/16/2017 1108   HGBUR MODERATE (A) 03/16/2017 1108   BILIRUBINUR NEGATIVE 03/16/2017 1108   KETONESUR NEGATIVE 03/16/2017 1108   PROTEINUR NEGATIVE 03/16/2017 1108   NITRITE NEGATIVE 03/16/2017 1108   LEUKOCYTESUR NEGATIVE 03/16/2017 1108    Radiological Exams on Admission: US Pelvis Limited  Result Date: 03/16/2017 CLINICAL DATA:  Groin pain. Swelling of the penis. The patient has been self catheterizing to dilate the urethra, reportedly in a non sterile fashion. EXAM: SCROTAL ULTRASOUND DOPPLER ULTRASOUND OF THE  TESTICLES PENILE ULTRASOUND TECHNIQUE: Complete ultrasound examination of the testicles, epididymis, and other scrotal structures was performed. Color and spectral Doppler ultrasound were also utilized to evaluate blood flow to the testicles. Sonographic assessment of the penis was performed. COMPARISON:  None. FINDINGS: Right testicle Measurements: 4.9 by 2.4 by 2.6 cm (volume = 16 cm^3). No mass or microlithiasis visualized. Left testicle Measurements: 3.8 by 2.0 by 2.9 cm (volume = 12 cm^3). No mass or microlithiasis visualized. Right epididymis:  Normal in size and appearance. Left epididymis:  Normal in size and appearance. Hydrocele:  Small right hydrocele Varicocele:  None visualized. Pulsed Doppler interrogation of both testes demonstrates normal low resistance arterial and venous waveforms bilaterally. Penis: The corpora cavernosa appear unremarkable. Slight echogenicity of the corpus spongiosum/urethra noted. Expanded subcutaneous tissues are observed with some accentuated hypoechoic regions particularly along the left ventral penis, with internal vascular flow. There some linear areas of echogenicity with surrounding hypoechogenicity for example on image 62 and image 56 within the subcutaneous tissues. The  did not observe any of these hyper echogenicities 2 shadow. No obvious discontinuity in the tunica albuginea to suggest a penile fracture. IMPRESSION: 1. Abnormal expansion of the subcutaneous tissues of the penis, slightly asymmetric and especially towards the left side but otherwise circumferential. The underlying inflammatory phlegmon is a possibility. Reportedly the patient has been self catheterizing the urethra, and the possibility of a urethral tear with hematoma or infection spreading into the subcutaneous tissues at the penis is not excluded given the current abnormal appearance. I do not see a shadowing abnormality to suggest a typical foreign body in the subcutaneous tissues. The corpora  cavernosa appear relatively unremarkable and a Doppler imaging we did not see an obvious vascular abnormality along the corpora. 2. Slightly irregular appearance of the corpus spongiosum/urethra but without an obviously defined tear. Clearly other exams are more suited to assessing for urethral tears. 3. There is some mild asymmetry of testicular size with the right side larger than the left, along with a small right hydrocele, but no other compelling findings of orchitis or epididymitis. Electronically Signed   By: Gaylyn Rong M.D.   On: 03/16/2017 13:35   US Scrotum  Result Date: 03/16/2017 CLINICAL DATA:  Groin pain. Swelling of the penis. The patient has been self catheterizing to dilate the urethra, reportedly in a non sterile fashion. EXAM: SCROTAL ULTRASOUND DOPPLER ULTRASOUND OF THE TESTICLES PENILE ULTRASOUND TECHNIQUE: Complete ultrasound examination of the testicles, epididymis, and other scrotal structures was performed. Color and spectral Doppler ultrasound were also utilized to evaluate blood flow to the testicles. Sonographic assessment of the penis was performed. COMPARISON:  None. FINDINGS: Right testicle Measurements: 4.9 by 2.4 by 2.6 cm (volume = 16 cm^3). No mass or microlithiasis visualized. Left testicle Measurements: 3.8 by 2.0 by 2.9 cm (volume = 12 cm^3). No mass or microlithiasis visualized. Right epididymis:  Normal in size and appearance. Left epididymis:  Normal in size and appearance. Hydrocele:  Small right hydrocele Varicocele:  None visualized. Pulsed Doppler interrogation of both testes demonstrates normal low resistance arterial and venous waveforms bilaterally. Penis: The corpora cavernosa appear unremarkable. Slight echogenicity of the corpus spongiosum/urethra noted. Expanded subcutaneous tissues are observed with some accentuated hypoechoic regions particularly along the left ventral penis, with internal vascular flow. There some linear areas of echogenicity with  surrounding hypoechogenicity for example on image 62 and image 56 within the subcutaneous tissues. The did not observe any of these hyper echogenicities 2 shadow. No obvious discontinuity in the tunica albuginea to suggest a penile fracture. IMPRESSION: 1. Abnormal expansion of the subcutaneous tissues of the penis, slightly asymmetric and especially towards the left side but otherwise circumferential. The underlying inflammatory phlegmon is a possibility. Reportedly the patient has been self catheterizing the urethra, and the possibility of a urethral tear with hematoma or infection spreading into the subcutaneous tissues at the penis is not excluded given the current abnormal appearance. I do not see a shadowing abnormality to suggest a typical foreign body in the subcutaneous tissues. The corpora cavernosa appear relatively unremarkable and a Doppler imaging we did not see an obvious vascular abnormality along the corpora. 2. Slightly irregular appearance of the corpus spongiosum/urethra but without an obviously defined tear. Clearly other exams are more suited to assessing for urethral tears. 3. There is some mild asymmetry of testicular size with the right side larger than the left, along with a small right hydrocele, but no other compelling findings of orchitis or epididymitis. Electronically Signed   By: Zollie Beckers  Ova Freshwater M.D.   On: 03/16/2017 13:35   [place] Art/ven Flow Abd Pelv Doppler  Result Date: 03/16/2017 CLINICAL DATA:  Groin pain. Swelling of the penis. The patient has been self catheterizing to dilate the urethra, reportedly in a non sterile fashion. EXAM: SCROTAL ULTRASOUND DOPPLER ULTRASOUND OF THE TESTICLES PENILE ULTRASOUND TECHNIQUE: Complete ultrasound examination of the testicles, epididymis, and other scrotal structures was performed. Color and spectral Doppler ultrasound were also utilized to evaluate blood flow to the testicles. Sonographic assessment of the penis was performed.  COMPARISON:  None. FINDINGS: Right testicle Measurements: 4.9 by 2.4 by 2.6 cm (volume = 16 cm^3). No mass or microlithiasis visualized. Left testicle Measurements: 3.8 by 2.0 by 2.9 cm (volume = 12 cm^3). No mass or microlithiasis visualized. Right epididymis:  Normal in size and appearance. Left epididymis:  Normal in size and appearance. Hydrocele:  Small right hydrocele Varicocele:  None visualized. Pulsed Doppler interrogation of both testes demonstrates normal low resistance arterial and venous waveforms bilaterally. Penis: The corpora cavernosa appear unremarkable. Slight echogenicity of the corpus spongiosum/urethra noted. Expanded subcutaneous tissues are observed with some accentuated hypoechoic regions particularly along the left ventral penis, with internal vascular flow. There some linear areas of echogenicity with surrounding hypoechogenicity for example on image 62 and image 56 within the subcutaneous tissues. The did not observe any of these hyper echogenicities 2 shadow. No obvious discontinuity in the tunica albuginea to suggest a penile fracture. IMPRESSION: 1. Abnormal expansion of the subcutaneous tissues of the penis, slightly asymmetric and especially towards the left side but otherwise circumferential. The underlying inflammatory phlegmon is a possibility. Reportedly the patient has been self catheterizing the urethra, and the possibility of a urethral tear with hematoma or infection spreading into the subcutaneous tissues at the penis is not excluded given the current abnormal appearance. I do not see a shadowing abnormality to suggest a typical foreign body in the subcutaneous tissues. The corpora cavernosa appear relatively unremarkable and a Doppler imaging we did not see an obvious vascular abnormality along the corpora. 2. Slightly irregular appearance of the corpus spongiosum/urethra but without an obviously defined tear. Clearly other exams are more suited to assessing for urethral  tears. 3. There is some mild asymmetry of testicular size with the right side larger than the left, along with a small right hydrocele, but no other compelling findings of orchitis or epididymitis. Electronically Signed   By: Gaylyn Rong M.D.   On: 03/16/2017 13:35    Assessment/Plan Active Problems:   Cellulitis   Penile cellulitis   Generalized weakness     1. Penile cellulitis. Started on vancomycin and Zosyn. Urology has been consulted. Patient underwent testicular ultrasound that did show small right hydrocele, but no compelling findings of orchitis or epididymitis. Obvious urethral tear could not be identified, but there was abnormal expansion of the subcutaneous tissues of the penis especially towards the left. Possibility of urethral tear with hematoma or infection cannot be excluded. Continue supportive measures with anti-inflammatories, pain medications and antibiotics. Await further input from urology. Patient is able to pass his urine at this time.  2. Generalized weakness. Suspect is related to dehydration and infection. Continue to monitor.  DVT prophylaxis:heparin Code Status: full code Family Communication: no family present Disposition Plan: discharge home once improved Consults called: urology, Dr. Annabell Howells Admission status: inpatient, Link Snuffer MD [facility] Pager [phone]  If 7PM-7AM, please contact night-coverage [email or web address] Password [facility]  03/16/2017, 6:52 PM

## 2017-03-17 DIAGNOSIS — N4822 Cellulitis of corpus cavernosum and penis: Secondary | ICD-10-CM

## 2017-03-17 DIAGNOSIS — R3912 Poor urinary stream: Secondary | ICD-10-CM

## 2017-03-17 LAB — URINE CULTURE

## 2017-03-17 LAB — BASIC METABOLIC PANEL
Anion gap: 8 (ref 5–15)
BUN: 22 mg/dL — AB (ref 6–20)
CALCIUM: 8.8 mg/dL — AB (ref 8.9–10.3)
CHLORIDE: 101 mmol/L (ref 101–111)
CO2: 26 mmol/L (ref 22–32)
Creatinine, Ser: 1.16 mg/dL (ref 0.61–1.24)
GFR calc non Af Amer: >60 mL/min (ref >60)
Glucose, Bld: 96 mg/dL (ref 65–99)
Potassium: 3.9 mmol/L (ref 3.5–5.1)
SODIUM: 135 mmol/L (ref 135–145)

## 2017-03-17 LAB — CBC
HCT: 46.1 % (ref 39.0–52.0)
Hemoglobin: 15.7 g/dL (ref 13.0–17.0)
MCH: 32.8 pg (ref 26.0–34.0)
MCHC: 34.1 g/dL (ref 30.0–36.0)
MCV: 96.4 fL (ref 78.0–100.0)
PLATELETS: 236 10*3/uL (ref 150–400)
RBC: 4.78 MIL/uL (ref 4.22–5.81)
RDW: 14 % (ref 11.5–15.5)
WBC: 12.5 10*3/uL — ABNORMAL HIGH (ref 4.0–10.5)

## 2017-03-17 NOTE — Progress Notes (Signed)
Subjective: Patient reports pain is not worse--"penis feels tight".  Objective: Vital signs in last 24 hours: Temp:  [97.7 F (36.5 C)-99.5 F (37.5 C)] 99.5 F (37.5 C) (03/27 0738) Pulse Rate:  [71-104] 71 (03/27 0738) Resp:  [18] 18 (03/27 0738) BP: (95-131)/(62-84) 124/67 (03/27 0738) SpO2:  [95 %-100 %] 96 % (03/27 0738) Weight:  [93 kg (205 lb)] 93 kg (205 lb) (03/26 1057)  Intake/Output from previous day: 03/26 0701 - 03/27 0700 In: 931.3 [I.V.:681.3; IV Piggyback:250] Out: -  Intake/Output this shift: No intake/output data recorded.  Physical Exam:  Constitutional: Vital signs reviewed. WD WN in NAD   Eyes: PERRL, No scleral icterus.   Pulmonary/Chest: Normal effort Abdominal: Soft. Non-tender, non-distended, bowel sounds are normal, no masses, organomegaly, or guarding present.  Penis: Shaft significantly swollen. Mild bullous edema. No crepitus/fluctuance. Minimal tenderness. No erythema spreading to scrotum or sp area.   Lab Results:  Recent Labs  03/16/17 1327 03/17/17 0429  HGB 16.7 15.7  HCT 48.2 46.1   BMET  Recent Labs  03/16/17 1327 03/17/17 0429  NA 135 135  K 4.0 3.9  CL 103 101  CO2 25 26  GLUCOSE 102* 96  BUN 15 22*  CREATININE 0.91 1.16  CALCIUM 9.5 8.8*   No results for input(s): LABPT, INR in the last 72 hours. No results for input(s): LABURIN in the last 72 hours. No results found for this or any previous visit.  Studies/Results: US Pelvis Limited  Result Date: 03/16/2017 CLINICAL DATA:  Groin pain. Swelling of the penis. The patient has been self catheterizing to dilate the urethra, reportedly in a non sterile fashion. EXAM: SCROTAL ULTRASOUND DOPPLER ULTRASOUND OF THE TESTICLES PENILE ULTRASOUND TECHNIQUE: Complete ultrasound examination of the testicles, epididymis, and other scrotal structures was performed. Color and spectral Doppler ultrasound were also utilized to evaluate blood flow to the testicles. Sonographic  assessment of the penis was performed. COMPARISON:  None. FINDINGS: Right testicle Measurements: 4.9 by 2.4 by 2.6 cm (volume = 16 cm^3). No mass or microlithiasis visualized. Left testicle Measurements: 3.8 by 2.0 by 2.9 cm (volume = 12 cm^3). No mass or microlithiasis visualized. Right epididymis:  Normal in size and appearance. Left epididymis:  Normal in size and appearance. Hydrocele:  Small right hydrocele Varicocele:  None visualized. Pulsed Doppler interrogation of both testes demonstrates normal low resistance arterial and venous waveforms bilaterally. Penis: The corpora cavernosa appear unremarkable. Slight echogenicity of the corpus spongiosum/urethra noted. Expanded subcutaneous tissues are observed with some accentuated hypoechoic regions particularly along the left ventral penis, with internal vascular flow. There some linear areas of echogenicity with surrounding hypoechogenicity for example on image 62 and image 56 within the subcutaneous tissues. The did not observe any of these hyper echogenicities 2 shadow. No obvious discontinuity in the tunica albuginea to suggest a penile fracture. IMPRESSION: 1. Abnormal expansion of the subcutaneous tissues of the penis, slightly asymmetric and especially towards the left side but otherwise circumferential. The underlying inflammatory phlegmon is a possibility. Reportedly the patient has been self catheterizing the urethra, and the possibility of a urethral tear with hematoma or infection spreading into the subcutaneous tissues at the penis is not excluded given the current abnormal appearance. I do not see a shadowing abnormality to suggest a typical foreign body in the subcutaneous tissues. The corpora cavernosa appear relatively unremarkable and a Doppler imaging we did not see an obvious vascular abnormality along the corpora. 2. Slightly irregular appearance of the corpus spongiosum/urethra but without  an obviously defined tear. Clearly other exams are  more suited to assessing for urethral tears. 3. There is some mild asymmetry of testicular size with the right side larger than the left, along with a small right hydrocele, but no other compelling findings of orchitis or epididymitis. Electronically Signed   By: Gaylyn Rong M.D.   On: 03/16/2017 13:35   US Scrotum  Result Date: 03/16/2017 CLINICAL DATA:  Groin pain. Swelling of the penis. The patient has been self catheterizing to dilate the urethra, reportedly in a non sterile fashion. EXAM: SCROTAL ULTRASOUND DOPPLER ULTRASOUND OF THE TESTICLES PENILE ULTRASOUND TECHNIQUE: Complete ultrasound examination of the testicles, epididymis, and other scrotal structures was performed. Color and spectral Doppler ultrasound were also utilized to evaluate blood flow to the testicles. Sonographic assessment of the penis was performed. COMPARISON:  None. FINDINGS: Right testicle Measurements: 4.9 by 2.4 by 2.6 cm (volume = 16 cm^3). No mass or microlithiasis visualized. Left testicle Measurements: 3.8 by 2.0 by 2.9 cm (volume = 12 cm^3). No mass or microlithiasis visualized. Right epididymis:  Normal in size and appearance. Left epididymis:  Normal in size and appearance. Hydrocele:  Small right hydrocele Varicocele:  None visualized. Pulsed Doppler interrogation of both testes demonstrates normal low resistance arterial and venous waveforms bilaterally. Penis: The corpora cavernosa appear unremarkable. Slight echogenicity of the corpus spongiosum/urethra noted. Expanded subcutaneous tissues are observed with some accentuated hypoechoic regions particularly along the left ventral penis, with internal vascular flow. There some linear areas of echogenicity with surrounding hypoechogenicity for example on image 62 and image 56 within the subcutaneous tissues. The did not observe any of these hyper echogenicities 2 shadow. No obvious discontinuity in the tunica albuginea to suggest a penile fracture. IMPRESSION: 1.  Abnormal expansion of the subcutaneous tissues of the penis, slightly asymmetric and especially towards the left side but otherwise circumferential. The underlying inflammatory phlegmon is a possibility. Reportedly the patient has been self catheterizing the urethra, and the possibility of a urethral tear with hematoma or infection spreading into the subcutaneous tissues at the penis is not excluded given the current abnormal appearance. I do not see a shadowing abnormality to suggest a typical foreign body in the subcutaneous tissues. The corpora cavernosa appear relatively unremarkable and a Doppler imaging we did not see an obvious vascular abnormality along the corpora. 2. Slightly irregular appearance of the corpus spongiosum/urethra but without an obviously defined tear. Clearly other exams are more suited to assessing for urethral tears. 3. There is some mild asymmetry of testicular size with the right side larger than the left, along with a small right hydrocele, but no other compelling findings of orchitis or epididymitis. Electronically Signed   By: Gaylyn Rong M.D.   On: 03/16/2017 13:35   [place] Art/ven Flow Abd Pelv Doppler  Result Date: 03/16/2017 CLINICAL DATA:  Groin pain. Swelling of the penis. The patient has been self catheterizing to dilate the urethra, reportedly in a non sterile fashion. EXAM: SCROTAL ULTRASOUND DOPPLER ULTRASOUND OF THE TESTICLES PENILE ULTRASOUND TECHNIQUE: Complete ultrasound examination of the testicles, epididymis, and other scrotal structures was performed. Color and spectral Doppler ultrasound were also utilized to evaluate blood flow to the testicles. Sonographic assessment of the penis was performed. COMPARISON:  None. FINDINGS: Right testicle Measurements: 4.9 by 2.4 by 2.6 cm (volume = 16 cm^3). No mass or microlithiasis visualized. Left testicle Measurements: 3.8 by 2.0 by 2.9 cm (volume = 12 cm^3). No mass or microlithiasis visualized. Right epididymis:  Normal in size and appearance. Left epididymis:  Normal in size and appearance. Hydrocele:  Small right hydrocele Varicocele:  None visualized. Pulsed Doppler interrogation of both testes demonstrates normal low resistance arterial and venous waveforms bilaterally. Penis: The corpora cavernosa appear unremarkable. Slight echogenicity of the corpus spongiosum/urethra noted. Expanded subcutaneous tissues are observed with some accentuated hypoechoic regions particularly along the left ventral penis, with internal vascular flow. There some linear areas of echogenicity with surrounding hypoechogenicity for example on image 62 and image 56 within the subcutaneous tissues. The did not observe any of these hyper echogenicities 2 shadow. No obvious discontinuity in the tunica albuginea to suggest a penile fracture. IMPRESSION: 1. Abnormal expansion of the subcutaneous tissues of the penis, slightly asymmetric and especially towards the left side but otherwise circumferential. The underlying inflammatory phlegmon is a possibility. Reportedly the patient has been self catheterizing the urethra, and the possibility of a urethral tear with hematoma or infection spreading into the subcutaneous tissues at the penis is not excluded given the current abnormal appearance. I do not see a shadowing abnormality to suggest a typical foreign body in the subcutaneous tissues. The corpora cavernosa appear relatively unremarkable and a Doppler imaging we did not see an obvious vascular abnormality along the corpora. 2. Slightly irregular appearance of the corpus spongiosum/urethra but without an obviously defined tear. Clearly other exams are more suited to assessing for urethral tears. 3. There is some mild asymmetry of testicular size with the right side larger than the left, along with a small right hydrocele, but no other compelling findings of orchitis or epididymitis. Electronically Signed   By: Gaylyn RongWalter  Liebkemann M.D.   On:  03/16/2017 13:35    Assessment/Plan:   Penile cellulitis. Appears stable/non-progressing. No abscess seen. WBC down from yesterday.  OK to feed pt, cont iv abx. We'll check on tomorrow.   LOS: 1 day   Marcine MatarDAHLSTEDT, Noralee Dutko M 03/17/2017, 8:29 AM

## 2017-03-17 NOTE — Progress Notes (Signed)
PROGRESS NOTE    Charles Abbott  ZOX:[id number] DOB: 03/07/1966 DOA: 03/16/2017 PCP: No PCP Per Patient    Assessment & Plan:   Active Problems:   Cellulitis   Penile cellulitis   Generalized weakness   1. Penile cellulitis. Patient presented with erythema and swelling of penis. He had been self dilating a urethral stricture up until 2 weeks prior to admission. Ultrasound done in ED did not show any evidence of urethral tear. He has been started on intravenous antibiotics with vancomycin and zosyn. Leukocytosis improving. Urology has been following. Foley catheter placed by urology. Continue current treatments for now.  2. Generalized weakness. Related to mild dehydration. Improving.   DVT prophylaxis: heparin. Code Status: full code Family Communication: no family present Disposition Plan: discharge home once improved   Consultants:   urology  Procedures:     Antimicrobials:   Vancomycin 3/26>>  Zosyn 3/26>>    Subjective: Still has throbbing pain in penis  Objective: Vitals:   03/16/17 1630 03/16/17 1844 03/16/17 2238 03/17/17 0738  BP: 95/62 118/73 109/70 124/67  Pulse: 79 93 97 71  Resp:   18 18  Temp:  98.6 F (37 C) 98.3 F (36.8 C) 99.5 F (37.5 C)  TempSrc:  Oral Oral Oral  SpO2: 97% 100% 99% 96%  Weight:      Height:        Intake/Output Summary (Last 24 hours) at 03/17/17 1615 Last data filed at 03/17/17 0400  Gross per 24 hour  Intake           681.25 ml  Output                0 ml  Net           681.25 ml   Filed Weights   03/16/17 1057  Weight: 93 kg (205 lb)    Examination:  General exam: Appears calm and comfortable  Respiratory system: Clear to auscultation. Respiratory effort normal. Cardiovascular system: S1 & S2 heard, RRR. No JVD, murmurs, rubs, gallops or clicks. No pedal edema. Gastrointestinal system: Abdomen is nondistended, soft and nontender. No organomegaly or masses felt. Normal bowel sounds heard. Genitourinary:  shaft of penis is significantly red and swollen Central nervous system: Alert and oriented. No focal neurological deficits. Extremities: Symmetric 5 x 5 power. Skin: No rashes, lesions or ulcers Psychiatry: Judgement and insight appear normal. Mood & affect appropriate.     Data Reviewed: I have personally reviewed following labs and imaging studies  CBC:  Recent Labs Lab 03/16/17 1327 03/17/17 0429  WBC 14.1* 12.5*  NEUTROABS 10.4*  --   HGB 16.7 15.7  HCT 48.2 46.1  MCV 96.4 96.4  PLT 239 236   Basic Metabolic Panel:  Recent Labs Lab 03/16/17 1327 03/17/17 0429  NA 135 135  K 4.0 3.9  CL 103 101  CO2 25 26  GLUCOSE 102* 96  BUN 15 22*  CREATININE 0.91 1.16  CALCIUM 9.5 8.8*   GFR: Estimated Creatinine Clearance: 88.6 mL/min (by C-G formula based on SCr of 1.16 mg/dL). Liver Function Tests: No results for input(s): AST, ALT, ALKPHOS, BILITOT, PROT, ALBUMIN in the last 168 hours. No results for input(s): LIPASE, AMYLASE in the last 168 hours. No results for input(s): AMMONIA in the last 168 hours. Coagulation Profile: No results for input(s): INR, PROTIME in the last 168 hours. Cardiac Enzymes: No results for input(s): CKTOTAL, CKMB, CKMBINDEX, TROPONINI in the last 168 hours. BNP (last 3 results) No  results for input(s): PROBNP in the last 8760 hours. HbA1C: No results for input(s): HGBA1C in the last 72 hours. CBG: No results for input(s): GLUCAP in the last 168 hours. Lipid Profile: No results for input(s): CHOL, HDL, LDLCALC, TRIG, CHOLHDL, LDLDIRECT in the last 72 hours. Thyroid Function Tests: No results for input(s): TSH, T4TOTAL, FREET4, T3FREE, THYROIDAB in the last 72 hours. Anemia Panel: No results for input(s): VITAMINB12, FOLATE, FERRITIN, TIBC, IRON, RETICCTPCT in the last 72 hours. Sepsis Labs: No results for input(s): PROCALCITON, LATICACIDVEN in the last 168 hours.  Recent Results (from the past 240 hour(s))  Urine culture     Status:  Abnormal   Collection Time: 03/16/17 11:18 AM  Result Value Ref Range Status   Specimen Description URINE, CLEAN CATCH  Final   Special Requests NONE  Final   Culture (A)  Final    <10,000 COLONIES/mL INSIGNIFICANT GROWTH Performed at [facility], [address], [place], [place] [address]    Report Status 03/17/2017 FINAL  Final         Radiology Studies: US Pelvis Limited  Result Date: 03/16/2017 CLINICAL DATA:  Groin pain. Swelling of the penis. The patient has been self catheterizing to dilate the urethra, reportedly in a non sterile fashion. EXAM: SCROTAL ULTRASOUND DOPPLER ULTRASOUND OF THE TESTICLES PENILE ULTRASOUND TECHNIQUE: Complete ultrasound examination of the testicles, epididymis, and other scrotal structures was performed. Color and spectral Doppler ultrasound were also utilized to evaluate blood flow to the testicles. Sonographic assessment of the penis was performed. COMPARISON:  None. FINDINGS: Right testicle Measurements: 4.9 by 2.4 by 2.6 cm (volume = 16 cm^3). No mass or microlithiasis visualized. Left testicle Measurements: 3.8 by 2.0 by 2.9 cm (volume = 12 cm^3). No mass or microlithiasis visualized. Right epididymis:  Normal in size and appearance. Left epididymis:  Normal in size and appearance. Hydrocele:  Small right hydrocele Varicocele:  None visualized. Pulsed Doppler interrogation of both testes demonstrates normal low resistance arterial and venous waveforms bilaterally. Penis: The corpora cavernosa appear unremarkable. Slight echogenicity of the corpus spongiosum/urethra noted. Expanded subcutaneous tissues are observed with some accentuated hypoechoic regions particularly along the left ventral penis, with internal vascular flow. There some linear areas of echogenicity with surrounding hypoechogenicity for example on image 62 and image 56 within the subcutaneous tissues. The did not observe any of these hyper echogenicities 2 shadow. No obvious  discontinuity in the tunica albuginea to suggest a penile fracture. IMPRESSION: 1. Abnormal expansion of the subcutaneous tissues of the penis, slightly asymmetric and especially towards the left side but otherwise circumferential. The underlying inflammatory phlegmon is a possibility. Reportedly the patient has been self catheterizing the urethra, and the possibility of a urethral tear with hematoma or infection spreading into the subcutaneous tissues at the penis is not excluded given the current abnormal appearance. I do not see a shadowing abnormality to suggest a typical foreign body in the subcutaneous tissues. The corpora cavernosa appear relatively unremarkable and a Doppler imaging we did not see an obvious vascular abnormality along the corpora. 2. Slightly irregular appearance of the corpus spongiosum/urethra but without an obviously defined tear. Clearly other exams are more suited to assessing for urethral tears. 3. There is some mild asymmetry of testicular size with the right side larger than the left, along with a small right hydrocele, but no other compelling findings of orchitis or epididymitis. Electronically Signed   By: Gaylyn Rong M.D.   On: 03/16/2017 13:35   US  Scrotum  Result Date: 03/16/2017 CLINICAL DATA:  Groin pain. Swelling of the penis. The patient has been self catheterizing to dilate the urethra, reportedly in a non sterile fashion. EXAM: SCROTAL ULTRASOUND DOPPLER ULTRASOUND OF THE TESTICLES PENILE ULTRASOUND TECHNIQUE: Complete ultrasound examination of the testicles, epididymis, and other scrotal structures was performed. Color and spectral Doppler ultrasound were also utilized to evaluate blood flow to the testicles. Sonographic assessment of the penis was performed. COMPARISON:  None. FINDINGS: Right testicle Measurements: 4.9 by 2.4 by 2.6 cm (volume = 16 cm^3). No mass or microlithiasis visualized. Left testicle Measurements: 3.8 by 2.0 by 2.9 cm (volume = 12 cm^3).  No mass or microlithiasis visualized. Right epididymis:  Normal in size and appearance. Left epididymis:  Normal in size and appearance. Hydrocele:  Small right hydrocele Varicocele:  None visualized. Pulsed Doppler interrogation of both testes demonstrates normal low resistance arterial and venous waveforms bilaterally. Penis: The corpora cavernosa appear unremarkable. Slight echogenicity of the corpus spongiosum/urethra noted. Expanded subcutaneous tissues are observed with some accentuated hypoechoic regions particularly along the left ventral penis, with internal vascular flow. There some linear areas of echogenicity with surrounding hypoechogenicity for example on image 62 and image 56 within the subcutaneous tissues. The did not observe any of these hyper echogenicities 2 shadow. No obvious discontinuity in the tunica albuginea to suggest a penile fracture. IMPRESSION: 1. Abnormal expansion of the subcutaneous tissues of the penis, slightly asymmetric and especially towards the left side but otherwise circumferential. The underlying inflammatory phlegmon is a possibility. Reportedly the patient has been self catheterizing the urethra, and the possibility of a urethral tear with hematoma or infection spreading into the subcutaneous tissues at the penis is not excluded given the current abnormal appearance. I do not see a shadowing abnormality to suggest a typical foreign body in the subcutaneous tissues. The corpora cavernosa appear relatively unremarkable and a Doppler imaging we did not see an obvious vascular abnormality along the corpora. 2. Slightly irregular appearance of the corpus spongiosum/urethra but without an obviously defined tear. Clearly other exams are more suited to assessing for urethral tears. 3. There is some mild asymmetry of testicular size with the right side larger than the left, along with a small right hydrocele, but no other compelling findings of orchitis or epididymitis.  Electronically Signed   By: Gaylyn Rong M.D.   On: 03/16/2017 13:35   [place] Art/ven Flow Abd Pelv Doppler  Result Date: 03/16/2017 CLINICAL DATA:  Groin pain. Swelling of the penis. The patient has been self catheterizing to dilate the urethra, reportedly in a non sterile fashion. EXAM: SCROTAL ULTRASOUND DOPPLER ULTRASOUND OF THE TESTICLES PENILE ULTRASOUND TECHNIQUE: Complete ultrasound examination of the testicles, epididymis, and other scrotal structures was performed. Color and spectral Doppler ultrasound were also utilized to evaluate blood flow to the testicles. Sonographic assessment of the penis was performed. COMPARISON:  None. FINDINGS: Right testicle Measurements: 4.9 by 2.4 by 2.6 cm (volume = 16 cm^3). No mass or microlithiasis visualized. Left testicle Measurements: 3.8 by 2.0 by 2.9 cm (volume = 12 cm^3). No mass or microlithiasis visualized. Right epididymis:  Normal in size and appearance. Left epididymis:  Normal in size and appearance. Hydrocele:  Small right hydrocele Varicocele:  None visualized. Pulsed Doppler interrogation of both testes demonstrates normal low resistance arterial and venous waveforms bilaterally. Penis: The corpora cavernosa appear unremarkable. Slight echogenicity of the corpus spongiosum/urethra noted. Expanded subcutaneous tissues are observed with some accentuated hypoechoic regions particularly along the left ventral  penis, with internal vascular flow. There some linear areas of echogenicity with surrounding hypoechogenicity for example on image 62 and image 56 within the subcutaneous tissues. The did not observe any of these hyper echogenicities 2 shadow. No obvious discontinuity in the tunica albuginea to suggest a penile fracture. IMPRESSION: 1. Abnormal expansion of the subcutaneous tissues of the penis, slightly asymmetric and especially towards the left side but otherwise circumferential. The underlying inflammatory phlegmon is a possibility. Reportedly  the patient has been self catheterizing the urethra, and the possibility of a urethral tear with hematoma or infection spreading into the subcutaneous tissues at the penis is not excluded given the current abnormal appearance. I do not see a shadowing abnormality to suggest a typical foreign body in the subcutaneous tissues. The corpora cavernosa appear relatively unremarkable and a Doppler imaging we did not see an obvious vascular abnormality along the corpora. 2. Slightly irregular appearance of the corpus spongiosum/urethra but without an obviously defined tear. Clearly other exams are more suited to assessing for urethral tears. 3. There is some mild asymmetry of testicular size with the right side larger than the left, along with a small right hydrocele, but no other compelling findings of orchitis or epididymitis. Electronically Signed   By: Gaylyn Rong M.D.   On: 03/16/2017 13:35        Scheduled Meds: . heparin  5,000 Units Subcutaneous Q8H  . ketorolac  30 mg Intravenous Q6H  . nicotine  21 mg Transdermal QHS  . piperacillin-tazobactam (ZOSYN)  IV  3.375 g Intravenous Q8H  . vancomycin  1,250 mg Intravenous Q12H   Continuous Infusions: . sodium chloride 75 mL/hr at 03/17/17 0400     LOS: 1 day    Time spent:    Undrea Archbold, MD [facility] Pager [phone]  If 7PM-7AM, please contact night-coverage [email or web address] Password [facility] 03/17/2017, 4:15 PM

## 2017-03-18 DIAGNOSIS — N4822 Cellulitis of corpus cavernosum and penis: Secondary | ICD-10-CM

## 2017-03-18 DIAGNOSIS — R3912 Poor urinary stream: Secondary | ICD-10-CM

## 2017-03-18 LAB — CBC
HEMATOCRIT: 43 % (ref 39.0–52.0)
HEMOGLOBIN: 14.8 g/dL (ref 13.0–17.0)
MCH: 33.3 pg (ref 26.0–34.0)
MCHC: 34.4 g/dL (ref 30.0–36.0)
MCV: 96.6 fL (ref 78.0–100.0)
Platelets: 220 10*3/uL (ref 150–400)
RBC: 4.45 MIL/uL (ref 4.22–5.81)
RDW: 14 % (ref 11.5–15.5)
WBC: 12.3 10*3/uL — AB (ref 4.0–10.5)

## 2017-03-18 LAB — BASIC METABOLIC PANEL
ANION GAP: 7 (ref 5–15)
BUN: 14 mg/dL (ref 6–20)
CALCIUM: 8.6 mg/dL — AB (ref 8.9–10.3)
CHLORIDE: 105 mmol/L (ref 101–111)
CO2: 25 mmol/L (ref 22–32)
Creatinine, Ser: 0.99 mg/dL (ref 0.61–1.24)
GFR calc non Af Amer: >60 mL/min (ref >60)
Glucose, Bld: 97 mg/dL (ref 65–99)
POTASSIUM: 4 mmol/L (ref 3.5–5.1)
Sodium: 137 mmol/L (ref 135–145)

## 2017-03-18 LAB — HIV ANTIBODY (ROUTINE TESTING W REFLEX): HIV SCREEN 4TH GENERATION: NONREACTIVE

## 2017-03-18 LAB — VANCOMYCIN, TROUGH: Vancomycin Tr: 11 ug/mL — ABNORMAL LOW (ref 15–20)

## 2017-03-18 NOTE — Progress Notes (Signed)
Pharmacy Antibiotic Note  Charles Abbott is a 51 y.o. male admitted on 03/16/2017 with cellulitis.  Pharmacy has been consulted for vancomycin and  dosing. Vancomycin 2gm total ordered and zosyn 3.375 gm given. Leukocytosis improving, pt afebrile. Vancomycin trough therapeutic, continue current regimen  Plan: Continue Zosyn 3.375g IV q8h (4 hour infusion).  Continue Vancomycin 1250 mg IV q12 hours Goal trough 10-415mcg/ml f/u renal function, cultures and clinical course Deescalate therapy as indicated  Height: 6\' 2"  (188 cm) Weight: 205 lb (93 kg) IBW/kg (Calculated) : 82.2  Temp (24hrs), Avg:98.1 F (36.7 C), Min:97.9 F (36.6 C), Max:98.2 F (36.8 C)   Recent Labs Lab 03/16/17 1327 03/17/17 0429 03/18/17 0427 03/18/17 1551  WBC 14.1* 12.5* 12.3*  --   CREATININE 0.91 1.16 0.99  --   VANCOTROUGH  --   --   --  11*    Estimated Creatinine Clearance: 103.8 mL/min (by C-G formula based on SCr of 0.99 mg/dL).    No Known Allergies  Antimicrobials this admission: vanc 3/26 >>  zosyn 3/26 >>    Microbiology results: 3/26 UCx: insignificant growth   Thank you for allowing pharmacy to be a part of this patient's care.  Elder CyphersLorie Xochilth Standish, BS Pharm D, [place] Clinical Pharmacist Pager [phone] 03/18/2017 5:13 PM

## 2017-03-18 NOTE — Progress Notes (Signed)
PROGRESS NOTE    Charles Abbott  ZOX:[id number] DOB: 03/06/1966 DOA: 03/16/2017 PCP: No PCP Per Patient   HPI: Charles Abbott is a 51 y.o. male with medical history significant of appendectomy approximately 5 years ago where he was hospitalized and reports having a prolonged Foley catheter, following which he had a urethral stricture, he reported doing urethral dilatations at home and suddenly developed  A bump when he was lifting heavy, following a couple of days , he developed penile cellulitis.   Assessment & Plan:   Active Problems:   Cellulitis   Penile cellulitis   Generalized weakness   1. Penile cellulitis. Patient presented with erythema and swelling of penis. He had been self dilating a urethral stricture up until 2 weeks prior to admission. Ultrasound done in ED did not show any evidence of urethral tear. He has been started on intravenous antibiotics with vancomycin and zosyn. Leukocytosis improving. Urology has been following. Foley catheter placed by urology. Continue current treatments for now. Pain not well controlled. Will add IV pain meds.   2. Generalized weakness. Related to mild dehydration. Improving. Recommended ambulation.    DVT prophylaxis: heparin. Code Status: full code Family Communication: no family present Disposition Plan: discharge home once improved   Consultants:   urology  Procedures:   None.  Antimicrobials:   Vancomycin 3/26>>  Zosyn 3/26>>    Subjective:  Mild improvement in pain.  Objective: Vitals:   03/17/17 0738 03/17/17 2129 03/18/17 0643 03/18/17 1527  BP: 124/67 118/62 121/61 113/71  Pulse: 71 67 90 77  Resp: 18 18 18 16   Temp: 99.5 F (37.5 C) 97.9 F (36.6 C) 98.2 F (36.8 C) 98.2 F (36.8 C)  TempSrc: Oral Oral Oral Oral  SpO2: 96% 99% 97% 96%  Weight:      Height:        Intake/Output Summary (Last 24 hours) at 03/18/17 1658 Last data filed at 03/18/17 1528  Gross per 24 hour  Intake             1210 ml    Output             4000 ml  Net            -2790 ml   Filed Weights   03/16/17 1057  Weight: 93 kg (205 lb)    Examination:  General exam: Appears calm and comfortable  Respiratory system: Clear to auscultation. Respiratory effort normal. Cardiovascular system: S1 & S2 heard, RRR. No JVD, murmurs, rubs, gallops or clicks. No pedal edema. Gastrointestinal system: Abdomen is nondistended, soft and nontender. No organomegaly or masses felt. Normal bowel sounds heard. Genitourinary: shaft of penis is significantly red and swollen Central nervous system: Alert and oriented. No focal neurological deficits. Extremities: Symmetric 5 x 5 power. Skin: No rashes, lesions or ulcers Psychiatry: Judgement and insight appear normal. Mood & affect appropriate.     Data Reviewed: I have personally reviewed following labs and imaging studies  CBC:  Recent Labs Lab 03/16/17 1327 03/17/17 0429 03/18/17 0427  WBC 14.1* 12.5* 12.3*  NEUTROABS 10.4*  --   --   HGB 16.7 15.7 14.8  HCT 48.2 46.1 43.0  MCV 96.4 96.4 96.6  PLT 239 236 220   Basic Metabolic Panel:  Recent Labs Lab 03/16/17 1327 03/17/17 0429 03/18/17 0427  NA 135 135 137  K 4.0 3.9 4.0  CL 103 101 105  CO2 25 26 25   GLUCOSE 102* 96 97  BUN  15 22* 14  CREATININE 0.91 1.16 0.99  CALCIUM 9.5 8.8* 8.6*   GFR: Estimated Creatinine Clearance: 103.8 mL/min (by C-G formula based on SCr of 0.99 mg/dL). Liver Function Tests: No results for input(s): AST, ALT, ALKPHOS, BILITOT, PROT, ALBUMIN in the last 168 hours. No results for input(s): LIPASE, AMYLASE in the last 168 hours. No results for input(s): AMMONIA in the last 168 hours. Coagulation Profile: No results for input(s): INR, PROTIME in the last 168 hours. Cardiac Enzymes: No results for input(s): CKTOTAL, CKMB, CKMBINDEX, TROPONINI in the last 168 hours. BNP (last 3 results) No results for input(s): PROBNP in the last 8760 hours. HbA1C: No results for input(s):  HGBA1C in the last 72 hours. CBG: No results for input(s): GLUCAP in the last 168 hours. Lipid Profile: No results for input(s): CHOL, HDL, LDLCALC, TRIG, CHOLHDL, LDLDIRECT in the last 72 hours. Thyroid Function Tests: No results for input(s): TSH, T4TOTAL, FREET4, T3FREE, THYROIDAB in the last 72 hours. Anemia Panel: No results for input(s): VITAMINB12, FOLATE, FERRITIN, TIBC, IRON, RETICCTPCT in the last 72 hours. Sepsis Labs: No results for input(s): PROCALCITON, LATICACIDVEN in the last 168 hours.  Recent Results (from the past 240 hour(s))  Urine culture     Status: Abnormal   Collection Time: 03/16/17 11:18 AM  Result Value Ref Range Status   Specimen Description URINE, CLEAN CATCH  Final   Special Requests NONE  Final   Culture (A)  Final    <10,000 COLONIES/mL INSIGNIFICANT GROWTH Performed at [facility], [address], [place], [place] [address]    Report Status 03/17/2017 FINAL  Final         Radiology Studies: No results found.      Scheduled Meds: . heparin  5,000 Units Subcutaneous Q8H  . nicotine  21 mg Transdermal QHS  . piperacillin-tazobactam (ZOSYN)  IV  3.375 g Intravenous Q8H  . vancomycin  1,250 mg Intravenous Q12H   Continuous Infusions: . sodium chloride 75 mL/hr at 03/17/17 1825     LOS: 2 days    Time spent:    Nakema Fake, MD [facility] Pager [phone]  If 7PM-7AM, please contact night-coverage [email or web address] Password [facility] 03/18/2017, 4:58 PM

## 2017-03-18 NOTE — Progress Notes (Signed)
  Subjective: Patient reports penile pain improving. Leukocytosis improving.   Objective: Vital signs in last 24 hours: Temp:  [98.2 F (36.8 C)-98.3 F (36.8 C)] 98.3 F (36.8 C) (03/28 2143) Pulse Rate:  [77-90] 81 (03/28 2143) Resp:  [16-18] 16 (03/28 1527) BP: (113-126)/(61-71) 126/67 (03/28 2143) SpO2:  [96 %-99 %] 99 % (03/28 2143)  Intake/Output from previous day: 03/27 0701 - 03/28 0700 In: 2557.5 [P.O.:1440; I.V.:817.5; IV Piggyback:300] Out: 2000 [Urine:2000] Intake/Output this shift: No intake/output data recorded.  Physical Exam:  Constitutional: Vital signs reviewed. WD WN in NAD   Eyes: PERRL, No scleral icterus.   Pulmonary/Chest: Normal effort Abdominal: Soft. Non-tender, non-distended, bowel sounds are normal, no masses, organomegaly, or guarding present.  Penis: Shaft significantly swollen. Mild bullous edema. No crepitus/fluctuance. Minimal tenderness. No erythema spreading to scrotum or sp area.   Lab Results:  Recent Labs  03/16/17 1327 03/17/17 0429 03/18/17 0427  HGB 16.7 15.7 14.8  HCT 48.2 46.1 43.0   BMET  Recent Labs  03/17/17 0429 03/18/17 0427  NA 135 137  K 3.9 4.0  CL 101 105  CO2 26 25  GLUCOSE 96 97  BUN 22* 14  CREATININE 1.16 0.99  CALCIUM 8.8* 8.6*   No results for input(s): LABPT, INR in the last 72 hours. No results for input(s): LABURIN in the last 72 hours. Results for orders placed or performed during the hospital encounter of 03/16/17  Urine culture     Status: Abnormal   Collection Time: 03/16/17 11:18 AM  Result Value Ref Range Status   Specimen Description URINE, CLEAN CATCH  Final   Special Requests NONE  Final   Culture (A)  Final    <10,000 COLONIES/mL INSIGNIFICANT GROWTH Performed at [facility], [address] [place], [place] [address]    Report Status 03/17/2017 FINAL  Final    Studies/Results: No results found.  Assessment/Plan: 50yo with penile cellulitis, improving.   1. COntinue  broad spectrum antibiotics and likely transition to bactrim after cultures finalized.   LOS: 2 days   Wilkie Ayeatrick Celeste Tavenner 03/18/2017, 11:15 PM

## 2017-03-18 NOTE — Care Management (Addendum)
Pt given contact info for [employer]. Instructed pt to call tomorrow 03/19/2017 between 8 and 4:30pm to discuss financial assistance options for urology f/u. CM will cont to follow for DC needs.

## 2017-03-18 NOTE — Care Management Note (Signed)
Case Management Note  Patient Details  Name: Fidel Levyony L Maring MRN: [id number] Date of Birth: 04/02/1966  Subjective/Objective:                  Admitted with cellulitis. Pt is from home, lives alone and ind with ADL's. He was requiring urethral dilation 4x per day. He was seen by urology while in prison and has no seen urology since his DC from prison. He is scheduled to start new job this week. He has no insurance, no PCP. Pt will need PCP follow up options and urology F/U.   [facility]/Plan: [facility]. CM will discuss PCP options and med assistance prior to DC. Will cont to follow.   Expected Discharge Date:       03/20/2017           Expected Discharge Plan:  Home/Self Care  In-House Referral:  Financial Counselor  Discharge planning Services  CM Consult  Post Acute Care Choice:  NA Choice offered to:  NA  Status of Service:  In process, will continue to follow  Malcolm MetroChildress, Prudy Candy Demske, RN 03/18/2017, 1:26 PM

## 2017-03-19 MED ORDER — SENNOSIDES-DOCUSATE SODIUM 8.6-50 MG PO TABS
2.0000 | ORAL_TABLET | Freq: Two times a day (BID) | ORAL | Status: DC
  Administered 2017-03-19 – 2017-03-20 (×3): 2 via ORAL
  Filled 2017-03-19 (×3): qty 2

## 2017-03-19 MED ORDER — CEPHALEXIN 500 MG PO CAPS: 500.0000 mg | ORAL_CAPSULE | Freq: Two times a day (BID) | ORAL | 0 refills | Status: DC

## 2017-03-19 MED ORDER — HYDROCODONE-ACETAMINOPHEN 5-325 MG PO TABS: 1.0000 | ORAL_TABLET | Freq: Four times a day (QID) | ORAL | 0 refills | Status: DC | PRN

## 2017-03-19 MED ORDER — POLYETHYLENE GLYCOL 3350 17 G PO PACK
17.0000 g | PACK | Freq: Two times a day (BID) | ORAL | Status: DC
  Administered 2017-03-19 – 2017-03-20 (×2): 17 g via ORAL
  Filled 2017-03-19 (×3): qty 1

## 2017-03-19 MED ORDER — POLYETHYLENE GLYCOL 3350 17 G PO PACK: 17.0000 g | PACK | Freq: Two times a day (BID) | ORAL | 0 refills | Status: AC

## 2017-03-19 MED ORDER — SENNOSIDES-DOCUSATE SODIUM 8.6-50 MG PO TABS: 2.0000 | ORAL_TABLET | Freq: Two times a day (BID) | ORAL | 0 refills | Status: AC

## 2017-03-19 MED ORDER — SULFAMETHOXAZOLE-TRIMETHOPRIM 800-160 MG PO TABS: 1.0000 | ORAL_TABLET | Freq: Two times a day (BID) | ORAL | 0 refills | Status: DC

## 2017-03-19 MED ORDER — HYDROCODONE-ACETAMINOPHEN 5-325 MG PO TABS
1.0000 | ORAL_TABLET | ORAL | Status: DC | PRN
  Administered 2017-03-19 – 2017-03-20 (×4): 2 via ORAL
  Filled 2017-03-19 (×4): qty 2

## 2017-03-19 NOTE — Progress Notes (Signed)
PROGRESS NOTE    Charles Abbott  RUE:[id number] DOB: 10/19/1966 DOA: 03/16/2017 PCP: No PCP Per Patient   HPI: Charles Abbott is a 51 y.o. male with medical history significant of appendectomy approximately 5 years ago where he was hospitalized and reports having a prolonged Foley catheter, following which he had a urethral stricture, he reported doing urethral dilatations at home and suddenly developed  A bump when he was lifting heavy, following a couple of days , he developed penile cellulitis.   Assessment & Plan:   Active Problems:   Cellulitis   Penile cellulitis   Generalized weakness   1. Penile cellulitis. Patient presented with erythema and swelling of penis. He had been self dilating a urethral stricture up until 2 weeks prior to admission. Ultrasound done in ED did not show any evidence of urethral tear. He has been started on intravenous antibiotics with vancomycin and zosyn. Leukocytosis improving. Urology has been following. Foley catheter placed by urology. Continue current treatments for now. Pain not well controlled. Increased his pain meds. Recommend keeping the foley catheter until he sees urology as outpatient for a voiding trial.   2. Generalized weakness. Related to mild dehydration. Improving. Recommended ambulation. Resume IV fluids.    DVT prophylaxis: heparin. Code Status: full code Family Communication: no family present Disposition Plan: discharge home once improved   Consultants:   urology  Procedures:   None.  Antimicrobials:   Vancomycin 3/26>>  Zosyn 3/26>>    Subjective:  Mild improvement in pain.  Objective: Vitals:   03/18/17 2143 03/18/17 2300 03/19/17 0618 03/19/17 1350  BP: 126/67  124/63 120/70  Pulse: 81  99 80  Resp:   18 18  Temp: 98.3 F (36.8 C)  98.5 F (36.9 C) 97.7 F (36.5 C)  TempSrc: Oral  Oral Oral  SpO2: 99% 98% 95% 97%  Weight:      Height:        Intake/Output Summary (Last 24 hours) at 03/19/17  1808 Last data filed at 03/19/17 1719  Gross per 24 hour  Intake             1520 ml  Output             1850 ml  Net             -330 ml   Filed Weights   03/16/17 1057  Weight: 93 kg (205 lb)    Examination:  General exam: Appears calm and comfortable  Respiratory system: Clear to auscultation. Respiratory effort normal. Cardiovascular system: S1 & S2 heard, RRR. No JVD, murmurs, rubs, gallops or clicks. No pedal edema. Gastrointestinal system: Abdomen is nondistended, soft and nontender. No organomegaly or masses felt. Normal bowel sounds heard. Genitourinary: shaft of penis is significantly red and swollen, not much improvement from yesterday.  Central nervous system: Alert and oriented. No focal neurological deficits. Extremities: Symmetric 5 x 5 power. Skin: No rashes, lesions or ulcers Psychiatry: Judgement and insight appear normal. Mood & affect appropriate.     Data Reviewed: I have personally reviewed following labs and imaging studies  CBC:  Recent Labs Lab 03/16/17 1327 03/17/17 0429 03/18/17 0427  WBC 14.1* 12.5* 12.3*  NEUTROABS 10.4*  --   --   HGB 16.7 15.7 14.8  HCT 48.2 46.1 43.0  MCV 96.4 96.4 96.6  PLT 239 236 220   Basic Metabolic Panel:  Recent Labs Lab 03/16/17 1327 03/17/17 0429 03/18/17 0427  NA 135 135 137  K  4.0 3.9 4.0  CL 103 101 105  CO2 25 26 25   GLUCOSE 102* 96 97  BUN 15 22* 14  CREATININE 0.91 1.16 0.99  CALCIUM 9.5 8.8* 8.6*   GFR: Estimated Creatinine Clearance: 103.8 mL/min (by C-G formula based on SCr of 0.99 mg/dL). Liver Function Tests: No results for input(s): AST, ALT, ALKPHOS, BILITOT, PROT, ALBUMIN in the last 168 hours. No results for input(s): LIPASE, AMYLASE in the last 168 hours. No results for input(s): AMMONIA in the last 168 hours. Coagulation Profile: No results for input(s): INR, PROTIME in the last 168 hours. Cardiac Enzymes: No results for input(s): CKTOTAL, CKMB, CKMBINDEX, TROPONINI in the  last 168 hours. BNP (last 3 results) No results for input(s): PROBNP in the last 8760 hours. HbA1C: No results for input(s): HGBA1C in the last 72 hours. CBG: No results for input(s): GLUCAP in the last 168 hours. Lipid Profile: No results for input(s): CHOL, HDL, LDLCALC, TRIG, CHOLHDL, LDLDIRECT in the last 72 hours. Thyroid Function Tests: No results for input(s): TSH, T4TOTAL, FREET4, T3FREE, THYROIDAB in the last 72 hours. Anemia Panel: No results for input(s): VITAMINB12, FOLATE, FERRITIN, TIBC, IRON, RETICCTPCT in the last 72 hours. Sepsis Labs: No results for input(s): PROCALCITON, LATICACIDVEN in the last 168 hours.  Recent Results (from the past 240 hour(s))  Urine culture     Status: Abnormal   Collection Time: 03/16/17 11:18 AM  Result Value Ref Range Status   Specimen Description URINE, CLEAN CATCH  Final   Special Requests NONE  Final   Culture (A)  Final    <10,000 COLONIES/mL INSIGNIFICANT GROWTH Performed at [facility], [address] [place], [place] [address]    Report Status 03/17/2017 FINAL  Final         Radiology Studies: No results found.      Scheduled Meds: . heparin  5,000 Units Subcutaneous Q8H  . nicotine  21 mg Transdermal QHS  . piperacillin-tazobactam (ZOSYN)  IV  3.375 g Intravenous Q8H  . polyethylene glycol  17 g Oral BID  . senna-docusate  2 tablet Oral BID  . vancomycin  1,250 mg Intravenous Q12H   Continuous Infusions: . sodium chloride 75 mL/hr at 03/19/17 1157     LOS: 3 days    Time spent: 25mins    Jahmari Esbenshade, MD [facility] Pager [phone]  If 7PM-7AM, please contact night-coverage [email or web address] Password [email or web address] 03/19/2017, 6:08 PM

## 2017-03-19 NOTE — Care Management Note (Signed)
Case Management Note  Patient Details  Name: Fidel Levyony L Iwata MRN: [id number] Date of Birth: 09/30/1966   Expected Discharge Date:    03/19/2017              Expected Discharge Plan:  Home/Self Care  In-House Referral:  Financial Counselor  Discharge planning Services  CM Consult  Post Acute Care Choice:  NA Choice offered to:  NA  Status of Service:  Completed, signed off  Additional Comments: Potential DC home today with urology r/u. [facility] with [facility] f/u. Pt given resources for PCP options in [place]. He plans to go to walk in times [facility]er for [facility] given also.    Malcolm Metrohildress, Makari Sanko Demske, RN 03/19/2017, 4:26 PM

## 2017-03-20 LAB — BASIC METABOLIC PANEL
Anion gap: 7 (ref 5–15)
BUN: 10 mg/dL (ref 6–20)
CO2: 25 mmol/L (ref 22–32)
CREATININE: 0.91 mg/dL (ref 0.61–1.24)
Calcium: 8.8 mg/dL — ABNORMAL LOW (ref 8.9–10.3)
Chloride: 105 mmol/L (ref 101–111)
GFR calc Af Amer: >60 mL/min (ref >60)
Glucose, Bld: 100 mg/dL — ABNORMAL HIGH (ref 65–99)
Potassium: 3.8 mmol/L (ref 3.5–5.1)
SODIUM: 137 mmol/L (ref 135–145)

## 2017-03-20 LAB — CBC
HCT: 42.2 % (ref 39.0–52.0)
Hemoglobin: 14.2 g/dL (ref 13.0–17.0)
MCH: 32.3 pg (ref 26.0–34.0)
MCHC: 33.6 g/dL (ref 30.0–36.0)
MCV: 96.1 fL (ref 78.0–100.0)
PLATELETS: 274 10*3/uL (ref 150–400)
RBC: 4.39 MIL/uL (ref 4.22–5.81)
RDW: 14 % (ref 11.5–15.5)
WBC: 5.8 10*3/uL (ref 4.0–10.5)

## 2017-03-20 NOTE — Progress Notes (Signed)
1450 d/c instructions and paperwork given to patient. IV catheter removed from LEFT FA, intact w/no s/s of infection or infiltration noted at this time. Foley catheter left in place as ordered and leg bag placed for patient prior to d/c home. Patient aware to f/u with Dr.Wrenn within 1 wk. Patient's friend picked up patient for transport home.

## 2017-03-21 NOTE — Discharge Summary (Signed)
Physician Discharge Summary  SOCRATES CAHOON ZOX:[id number] DOB: 19-Mar-1966 DOA: 03/16/2017  PCP: No PCP Per Patient  Admit date: 03/16/2017 Discharge date: 03/20/2017  Admitted From: Home.  Disposition: HOme.   Recommendations for Outpatient Follow-up:  1. Follow up with PCP in 1-2 weeks 2. Please obtain BMP/CBC in one week 3. Please follow up with Urology in one week.      Discharge Condition:stable.  CODE STATUS:full code.  Diet recommendation: Regular  Brief/Interim Summary: Kandis Cocking a 51 y.o.malewith medical history significant of appendectomy approximately 5 years ago where he was hospitalized and reports having a prolonged Foley catheter, following which he had a urethral stricture, he reported doing urethral dilatations at home and suddenly developed  A bump when he was lifting heavy, following a couple of days , he developed penile cellulitis.   Discharge Diagnoses:  Active Problems:   Cellulitis   Penile cellulitis   Generalized weakness   Penile cellulitis. Patient presented with erythema and swelling of penis. He had been self dilating a urethral stricture up until 2 weeks prior to admission. Ultrasound done in ED did not show any evidence of urethral tear. He has been started on intravenous antibiotics with vancomycin and zosyn. Leukocytosis improving. Urology has been following. Foley catheter placed by urology.  Recommend keeping the foley catheter until he sees urology as outpatient for a voiding trial. Discharged on oral bactrim and keflex to complete the course.   2. Generalized weakness. Related to mild dehydration. Resolved. Marland Kitchen    Discharge Instructions  Discharge Instructions    Diet general    Complete by:  As directed    Discharge instructions    Complete by:  As directed    Please follow up with urology in one week.     Allergies as of 03/20/2017   No Known Allergies     Medication List    TAKE these medications   cephALEXin 500 MG  capsule Commonly known as:  KEFLEX Take 1 capsule (500 mg total) by mouth 2 (two) times daily.   HYDROcodone-acetaminophen 5-325 MG tablet Commonly known as:  NORCO/VICODIN Take 1-2 tablets by mouth every 6 (six) hours as needed for moderate pain.   polyethylene glycol packet Commonly known as:  MIRALAX / GLYCOLAX Take 17 g by mouth 2 (two) times daily.   senna-docusate 8.6-50 MG tablet Commonly known as:  Senokot-S Take 2 tablets by mouth 2 (two) times daily.   sulfamethoxazole-trimethoprim 800-160 MG tablet Commonly known as:  BACTRIM DS,SEPTRA DS Take 1 tablet by mouth 2 (two) times daily.      Follow-up Information    Anner Crete, MD. Schedule an appointment as soon as possible for a visit in 1 week(s).   Specialty:  Urology Contact information: [address] [place] [address] [phone]          No Known Allergies  Consultations:  Urology.    Procedures/Studies: US Pelvis Limited  Result Date: 03/16/2017 CLINICAL DATA:  Groin pain. Swelling of the penis. The patient has been self catheterizing to dilate the urethra, reportedly in a non sterile fashion. EXAM: SCROTAL ULTRASOUND DOPPLER ULTRASOUND OF THE TESTICLES PENILE ULTRASOUND TECHNIQUE: Complete ultrasound examination of the testicles, epididymis, and other scrotal structures was performed. Color and spectral Doppler ultrasound were also utilized to evaluate blood flow to the testicles. Sonographic assessment of the penis was performed. COMPARISON:  None. FINDINGS: Right testicle Measurements: 4.9 by 2.4 by 2.6 cm (volume = 16 cm^3). No mass  or microlithiasis visualized. Left testicle Measurements: 3.8 by 2.0 by 2.9 cm (volume = 12 cm^3). No mass or microlithiasis visualized. Right epididymis:  Normal in size and appearance. Left epididymis:  Normal in size and appearance. Hydrocele:  Small right hydrocele Varicocele:  None visualized. Pulsed Doppler interrogation of both testes demonstrates normal  low resistance arterial and venous waveforms bilaterally. Penis: The corpora cavernosa appear unremarkable. Slight echogenicity of the corpus spongiosum/urethra noted. Expanded subcutaneous tissues are observed with some accentuated hypoechoic regions particularly along the left ventral penis, with internal vascular flow. There some linear areas of echogenicity with surrounding hypoechogenicity for example on image 62 and image 56 within the subcutaneous tissues. The did not observe any of these hyper echogenicities 2 shadow. No obvious discontinuity in the tunica albuginea to suggest a penile fracture. IMPRESSION: 1. Abnormal expansion of the subcutaneous tissues of the penis, slightly asymmetric and especially towards the left side but otherwise circumferential. The underlying inflammatory phlegmon is a possibility. Reportedly the patient has been self catheterizing the urethra, and the possibility of a urethral tear with hematoma or infection spreading into the subcutaneous tissues at the penis is not excluded given the current abnormal appearance. I do not see a shadowing abnormality to suggest a typical foreign body in the subcutaneous tissues. The corpora cavernosa appear relatively unremarkable and a Doppler imaging we did not see an obvious vascular abnormality along the corpora. 2. Slightly irregular appearance of the corpus spongiosum/urethra but without an obviously defined tear. Clearly other exams are more suited to assessing for urethral tears. 3. There is some mild asymmetry of testicular size with the right side larger than the left, along with a small right hydrocele, but no other compelling findings of orchitis or epididymitis. Electronically Signed   By: Gaylyn Rong M.D.   On: 03/16/2017 13:35   US Scrotum  Result Date: 03/16/2017 CLINICAL DATA:  Groin pain. Swelling of the penis. The patient has been self catheterizing to dilate the urethra, reportedly in a non sterile fashion. EXAM:  SCROTAL ULTRASOUND DOPPLER ULTRASOUND OF THE TESTICLES PENILE ULTRASOUND TECHNIQUE: Complete ultrasound examination of the testicles, epididymis, and other scrotal structures was performed. Color and spectral Doppler ultrasound were also utilized to evaluate blood flow to the testicles. Sonographic assessment of the penis was performed. COMPARISON:  None. FINDINGS: Right testicle Measurements: 4.9 by 2.4 by 2.6 cm (volume = 16 cm^3). No mass or microlithiasis visualized. Left testicle Measurements: 3.8 by 2.0 by 2.9 cm (volume = 12 cm^3). No mass or microlithiasis visualized. Right epididymis:  Normal in size and appearance. Left epididymis:  Normal in size and appearance. Hydrocele:  Small right hydrocele Varicocele:  None visualized. Pulsed Doppler interrogation of both testes demonstrates normal low resistance arterial and venous waveforms bilaterally. Penis: The corpora cavernosa appear unremarkable. Slight echogenicity of the corpus spongiosum/urethra noted. Expanded subcutaneous tissues are observed with some accentuated hypoechoic regions particularly along the left ventral penis, with internal vascular flow. There some linear areas of echogenicity with surrounding hypoechogenicity for example on image 62 and image 56 within the subcutaneous tissues. The did not observe any of these hyper echogenicities 2 shadow. No obvious discontinuity in the tunica albuginea to suggest a penile fracture. IMPRESSION: 1. Abnormal expansion of the subcutaneous tissues of the penis, slightly asymmetric and especially towards the left side but otherwise circumferential. The underlying inflammatory phlegmon is a possibility. Reportedly the patient has been self catheterizing the urethra, and the possibility of a urethral tear with hematoma or infection spreading  into the subcutaneous tissues at the penis is not excluded given the current abnormal appearance. I do not see a shadowing abnormality to suggest a typical foreign body  in the subcutaneous tissues. The corpora cavernosa appear relatively unremarkable and a Doppler imaging we did not see an obvious vascular abnormality along the corpora. 2. Slightly irregular appearance of the corpus spongiosum/urethra but without an obviously defined tear. Clearly other exams are more suited to assessing for urethral tears. 3. There is some mild asymmetry of testicular size with the right side larger than the left, along with a small right hydrocele, but no other compelling findings of orchitis or epididymitis. Electronically Signed   By: Gaylyn Rong M.D.   On: 03/16/2017 13:35   [place] Art/ven Flow Abd Pelv Doppler  Result Date: 03/16/2017 CLINICAL DATA:  Groin pain. Swelling of the penis. The patient has been self catheterizing to dilate the urethra, reportedly in a non sterile fashion. EXAM: SCROTAL ULTRASOUND DOPPLER ULTRASOUND OF THE TESTICLES PENILE ULTRASOUND TECHNIQUE: Complete ultrasound examination of the testicles, epididymis, and other scrotal structures was performed. Color and spectral Doppler ultrasound were also utilized to evaluate blood flow to the testicles. Sonographic assessment of the penis was performed. COMPARISON:  None. FINDINGS: Right testicle Measurements: 4.9 by 2.4 by 2.6 cm (volume = 16 cm^3). No mass or microlithiasis visualized. Left testicle Measurements: 3.8 by 2.0 by 2.9 cm (volume = 12 cm^3). No mass or microlithiasis visualized. Right epididymis:  Normal in size and appearance. Left epididymis:  Normal in size and appearance. Hydrocele:  Small right hydrocele Varicocele:  None visualized. Pulsed Doppler interrogation of both testes demonstrates normal low resistance arterial and venous waveforms bilaterally. Penis: The corpora cavernosa appear unremarkable. Slight echogenicity of the corpus spongiosum/urethra noted. Expanded subcutaneous tissues are observed with some accentuated hypoechoic regions particularly along the left ventral penis, with internal  vascular flow. There some linear areas of echogenicity with surrounding hypoechogenicity for example on image 62 and image 56 within the subcutaneous tissues. The did not observe any of these hyper echogenicities 2 shadow. No obvious discontinuity in the tunica albuginea to suggest a penile fracture. IMPRESSION: 1. Abnormal expansion of the subcutaneous tissues of the penis, slightly asymmetric and especially towards the left side but otherwise circumferential. The underlying inflammatory phlegmon is a possibility. Reportedly the patient has been self catheterizing the urethra, and the possibility of a urethral tear with hematoma or infection spreading into the subcutaneous tissues at the penis is not excluded given the current abnormal appearance. I do not see a shadowing abnormality to suggest a typical foreign body in the subcutaneous tissues. The corpora cavernosa appear relatively unremarkable and a Doppler imaging we did not see an obvious vascular abnormality along the corpora. 2. Slightly irregular appearance of the corpus spongiosum/urethra but without an obviously defined tear. Clearly other exams are more suited to assessing for urethral tears. 3. There is some mild asymmetry of testicular size with the right side larger than the left, along with a small right hydrocele, but no other compelling findings of orchitis or epididymitis. Electronically Signed   By: Gaylyn Rong M.D.   On: 03/16/2017 13:35      Subjective: No new complaints.   Discharge Exam: Vitals:   03/20/17 0528 03/20/17 1413  BP: 119/60 118/68  Pulse: 62 100  Resp: 16 20  Temp: 97.6 F (36.4 C) 97.5 F (36.4 C)   Vitals:   03/19/17 1350 03/19/17 2100 03/20/17 0528 03/20/17 1413  BP: 120/70 131/77 119/60  118/68  Pulse: 80 72 62 100  Resp: Temp: 97.7 F (36.5 C) 98.3 F (36.8 C) 97.6 F (36.4 C) 97.5 F (36.4 C)  TempSrc: Oral Oral Oral Oral  SpO2: 97% 98% 97% 100%  Weight:      Height:         General: Pt is alert, awake, not in acute distress Cardiovascular: RRR, S1/S2 +, no rubs, no gallops Respiratory: CTA bilaterally, no wheezing, no rhonchi Abdominal: Soft, NT, ND, bowel sounds + Extremities: no edema, no cyanosis    The results of significant diagnostics from this hospitalization (including imaging, microbiology, ancillary and laboratory) are listed below for reference.     Microbiology: Recent Results (from the past 240 hour(s))  Urine culture     Status: Abnormal   Collection Time: 03/16/17 11:18 AM  Result Value Ref Range Status   Specimen Description URINE, CLEAN CATCH  Final   Special Requests NONE  Final   Culture (A)  Final    <10,000 COLONIES/mL INSIGNIFICANT GROWTH Performed at [facility], [address], [place], [place] [address]    Report Status 03/17/2017 FINAL  Final     Labs: BNP (last 3 results) No results for input(s): BNP in the last 8760 hours. Basic Metabolic Panel:  Recent Labs Lab 03/16/17 1327 03/17/17 0429 03/18/17 0427 03/20/17 0455  NA 135 135 137 137  K 4.0 3.9 4.0 3.8  CL 103 101 105 105  CO2 GLUCOSE 102* 96 97 100*  BUN 15 22* 14 10  CREATININE 0.91 1.16 0.99 0.91  CALCIUM 9.5 8.8* 8.6* 8.8*   Liver Function Tests: No results for input(s): AST, ALT, ALKPHOS, BILITOT, PROT, ALBUMIN in the last 168 hours. No results for input(s): LIPASE, AMYLASE in the last 168 hours. No results for input(s): AMMONIA in the last 168 hours. CBC:  Recent Labs Lab 03/16/17 1327 03/17/17 0429 03/18/17 0427 03/20/17 0455  WBC 14.1* 12.5* 12.3* 5.8  NEUTROABS 10.4*  --   --   --   HGB 16.7 15.7 14.8 14.2  HCT 48.2 46.1 43.0 42.2  MCV 96.4 96.4 96.6 96.1  PLT 239 236 220 274   Cardiac Enzymes: No results for input(s): CKTOTAL, CKMB, CKMBINDEX, TROPONINI in the last 168 hours. BNP: Invalid input(s): POCBNP CBG: No results for input(s): GLUCAP in the last 168 hours. D-Dimer No results for input(s):  DDIMER in the last 72 hours. Hgb A1c No results for input(s): HGBA1C in the last 72 hours. Lipid Profile No results for input(s): CHOL, HDL, LDLCALC, TRIG, CHOLHDL, LDLDIRECT in the last 72 hours. Thyroid function studies No results for input(s): TSH, T4TOTAL, T3FREE, THYROIDAB in the last 72 hours.  Invalid input(s): FREET3 Anemia work up No results for input(s): VITAMINB12, FOLATE, FERRITIN, TIBC, IRON, RETICCTPCT in the last 72 hours. Urinalysis    Component Value Date/Time   COLORURINE STRAW (A) 03/16/2017 1108   APPEARANCEUR CLEAR 03/16/2017 1108   LABSPEC 1.003 (L) 03/16/2017 1108   PHURINE 6.0 03/16/2017 1108   GLUCOSEU NEGATIVE 03/16/2017 1108   HGBUR MODERATE (A) 03/16/2017 1108   BILIRUBINUR NEGATIVE 03/16/2017 1108   KETONESUR NEGATIVE 03/16/2017 1108   PROTEINUR NEGATIVE 03/16/2017 1108   NITRITE NEGATIVE 03/16/2017 1108   LEUKOCYTESUR NEGATIVE 03/16/2017 1108   Sepsis Labs Invalid input(s): PROCALCITONIN,  WBC,  LACTICIDVEN Microbiology Recent Results (from the past 240 hour(s))  Urine culture     Status: Abnormal   Collection Time: 03/16/17  11:18 AM  Result Value Ref Range Status   Specimen Description URINE, CLEAN CATCH  Final   Special Requests NONE  Final   Culture (A)  Final    <10,000 COLONIES/mL INSIGNIFICANT GROWTH Performed at [facility], [address], [place], [place] [address]    Report Status 03/17/2017 FINAL  Final     Time coordinating discharge: Over 30 minutes  SIGNED:   Kathlen Mody, MD  [facility] 03/21/2017, 1:02 PM Pager   If 7PM-7AM, please contact night-coverage [email or web address] Password [email or web address]

## 2017-03-25 ENCOUNTER — Encounter (HOSPITAL_COMMUNITY): Payer: Self-pay | Admitting: *Deleted

## 2017-03-25 ENCOUNTER — Emergency Department (HOSPITAL_COMMUNITY)
Admission: EM | Admit: 2017-03-25 | Discharge: 2017-03-25 | Disposition: A | Discharge status: Home / Self Care | Payer: Self-pay | Attending: Emergency Medicine | Admitting: Emergency Medicine

## 2017-03-25 DIAGNOSIS — F17200 Nicotine dependence, unspecified, uncomplicated: Secondary | ICD-10-CM | POA: Insufficient documentation

## 2017-03-25 DIAGNOSIS — Z466 Encounter for fitting and adjustment of urinary device: Secondary | ICD-10-CM | POA: Insufficient documentation

## 2017-03-25 DIAGNOSIS — Z79899 Other long term (current) drug therapy: Secondary | ICD-10-CM | POA: Insufficient documentation

## 2017-03-25 MED ORDER — CEPHALEXIN 500 MG PO CAPS: 500.0000 mg | ORAL_CAPSULE | Freq: Two times a day (BID) | ORAL | 0 refills | Status: DC

## 2017-03-25 MED ORDER — SULFAMETHOXAZOLE-TRIMETHOPRIM 800-160 MG PO TABS
1.0000 | ORAL_TABLET | Freq: Once | ORAL | Status: AC
  Administered 2017-03-25: 1 via ORAL
  Filled 2017-03-25: qty 1

## 2017-03-25 MED ORDER — CEPHALEXIN 500 MG PO CAPS
500.0000 mg | ORAL_CAPSULE | Freq: Once | ORAL | Status: AC
  Administered 2017-03-25: 500 mg via ORAL
  Filled 2017-03-25: qty 1

## 2017-03-25 MED ORDER — SULFAMETHOXAZOLE-TRIMETHOPRIM 800-160 MG PO TABS: 1.0000 | ORAL_TABLET | Freq: Two times a day (BID) | ORAL | 0 refills | Status: AC

## 2017-03-25 NOTE — Discharge Instructions (Signed)
You have been prescribed a few more days of the antibiotics as discussed.

## 2017-03-25 NOTE — ED Triage Notes (Signed)
Pt  Was admitted last week for cellulitis of the penis. While here they placed a foley in because of urethral strictures. Pt was to follow up with urology but he hasn't. Pt comes here because he wants the foley removed. Pt denies any swelling or irritation on penis.

## 2017-03-26 NOTE — ED Provider Notes (Signed)
[facility]-EMERGENCY DEPT Provider Note   CSN: [id number] Arrival date & time: 03/25/17  1839     History   Chief Complaint Chief Complaint  Patient presents with  . Urethral Cather Problems    HPI Charles Abbott is a 51 y.o. male with a recent history of admission for penile cellulitis felt to be related to chronic urethral dilitations secondary to chronic longstanding issues with urethral stricture.  He was discharged from here 5 days ago with plans for f/u urology visit for removal of the Foley that was placed during the admission. He has been unable to obtain transportation for this followup care and presents here for removal of the catheter.  He states he has an opportunity for a job but cannot do any work until the catheter is removed.  He reports having no pain and significant improvement in the redness and swelling of his penis since his hospital discharge.  Denies fevers, chills, dysuria or hematuria. He does endorse taking his dc antibiotics tid instead of bid for the first several days and will run out of the 7 day course after he takes his last doses this evening.  The history is provided by the patient.    History reviewed. No pertinent past medical history.  Patient Active Problem List   Diagnosis Date Noted  . Cellulitis 03/16/2017  . Penile cellulitis 03/16/2017  . Generalized weakness 03/16/2017    Past Surgical History:  Procedure Laterality Date  . APPENDECTOMY         Home Medications    Prior to Admission medications   Medication Sig Start Date End Date Taking? Authorizing Provider  cephALEXin (KEFLEX) 500 MG capsule Take 1 capsule (500 mg total) by mouth 2 (two) times daily. 03/26/17   Burgess Amor, PA-C  HYDROcodone-acetaminophen (NORCO/VICODIN) 5-325 MG tablet Take 1-2 tablets by mouth every 6 (six) hours as needed for moderate pain. 03/19/17   Kathlen Mody, MD  polyethylene glycol (MIRALAX / GLYCOLAX) packet Take 17 g by mouth 2 (two) times daily. 03/19/17    Kathlen Mody, MD  senna-docusate (SENOKOT-S) 8.6-50 MG tablet Take 2 tablets by mouth 2 (two) times daily. 03/19/17   Kathlen Mody, MD  sulfamethoxazole-trimethoprim (BACTRIM DS,SEPTRA DS) 800-160 MG tablet Take 1 tablet by mouth 2 (two) times daily. 03/26/17 03/29/17  Burgess Amor, PA-C    Family History No family history on file.  Social History Social History  Substance Use Topics  . Smoking status: Current Every Day Smoker    Packs/day: 1.00  . Smokeless tobacco: Never Used  . Alcohol use No     Allergies   Patient has no known allergies.   Review of Systems Review of Systems  Constitutional: Negative for fever.  HENT: Negative for congestion and sore throat.   Eyes: Negative.   Respiratory: Negative for chest tightness and shortness of breath.   Cardiovascular: Negative for chest pain.  Gastrointestinal: Negative for abdominal pain, nausea and vomiting.  Genitourinary: Negative.  Negative for difficulty urinating, dysuria, hematuria, penile pain, penile swelling, scrotal swelling and testicular pain.  Musculoskeletal: Negative for arthralgias, joint swelling and neck pain.  Skin: Negative.  Negative for rash and wound.  Neurological: Negative for dizziness, weakness, light-headedness, numbness and headaches.  Psychiatric/Behavioral: Negative.      Physical Exam Updated Vital Signs BP 136/75   Pulse 78   Temp 97.5 F (36.4 C) (Oral)   Resp 16   Ht  (1.88 m)   Wt 93 kg   SpO2 100%  BMI 26.32 kg/m   Physical Exam  Constitutional: He appears well-developed and well-nourished.  HENT:  Head: Normocephalic and atraumatic.  Eyes: Conjunctivae are normal.  Neck: Normal range of motion.  Cardiovascular: Normal rate, regular rhythm, normal heart sounds and intact distal pulses.   Pulmonary/Chest: Effort normal and breath sounds normal. He has no wheezes.  Abdominal: Soft. Bowel sounds are normal. There is no tenderness.  Genitourinary:  Genitourinary Comments:  Foley in place.  Mild distal glans erythema, much improved from exam from date of admission.  Musculoskeletal: Normal range of motion.  Neurological: He is alert.  Skin: Skin is warm and dry.  Psychiatric: He has a normal mood and affect.  Nursing note and vitals reviewed.    ED Treatments / Results  Labs (all labs ordered are listed, but only abnormal results are displayed) Labs Reviewed - No data to display  EKG  EKG Interpretation None       Radiology No results found.  Procedures Procedures (including critical care time)  Medications Ordered in ED Medications  cephALEXin (KEFLEX) capsule 500 mg (500 mg Oral Given 03/25/17 2007)  sulfamethoxazole-trimethoprim (BACTRIM DS,SEPTRA DS) 800-160 MG per tablet 1 tablet (1 tablet Oral Given 03/25/17 2007)     Initial Impression / Assessment and Plan / ED Course  I have reviewed the triage vital signs and the nursing notes.  Pertinent labs & imaging results that were available during my care of the patient were reviewed by me and considered in my medical decision making (see chart for details).     Discussed risks of possible urinary retention after removal of foley, discussed staying until he is able to urinate, as he is resistant to having urology remove during ov.  He understands risk, unwilling to wait in ed until can urinate, reports can easily return if he has urinary retention.  Foley was removed without difficulty.  Additionally, pt was given abx to cover for 3 additional days.  Plan f/u with urology prn or return here for any new probs or concerns.  Final Clinical Impressions(s) / ED Diagnoses   Final diagnoses:  Encounter for Foley catheter removal    New Prescriptions Discharge Medication List as of 03/25/2017  8:03 PM       Burgess Amor, PA-C 03/26/17 1342    Marily Memos, MD 03/26/17 1545

## 2017-04-01 ENCOUNTER — Emergency Department (HOSPITAL_COMMUNITY)
Admission: EM | Admit: 2017-04-01 | Discharge: 2017-04-01 | Disposition: A | Discharge status: Home / Self Care | Payer: Self-pay | Attending: Emergency Medicine | Admitting: Emergency Medicine

## 2017-04-01 ENCOUNTER — Encounter (HOSPITAL_COMMUNITY): Payer: Self-pay | Admitting: Emergency Medicine

## 2017-04-01 DIAGNOSIS — N4889 Other specified disorders of penis: Secondary | ICD-10-CM | POA: Insufficient documentation

## 2017-04-01 DIAGNOSIS — F17200 Nicotine dependence, unspecified, uncomplicated: Secondary | ICD-10-CM | POA: Insufficient documentation

## 2017-04-01 HISTORY — DX: Cellulitis, unspecified: L03.90

## 2017-04-01 MED ORDER — SULFAMETHOXAZOLE-TRIMETHOPRIM 800-160 MG PO TABS: 1.0000 | ORAL_TABLET | Freq: Two times a day (BID) | ORAL | 0 refills | Status: AC

## 2017-04-01 MED ORDER — OXYCODONE-ACETAMINOPHEN 5-325 MG PO TABS: 1.0000 | ORAL_TABLET | Freq: Four times a day (QID) | ORAL | 0 refills | Status: DC | PRN

## 2017-04-01 MED ORDER — CEPHALEXIN 500 MG PO CAPS: 500.0000 mg | ORAL_CAPSULE | Freq: Two times a day (BID) | ORAL | 0 refills | Status: DC

## 2017-04-01 NOTE — ED Triage Notes (Signed)
Pt admitted last week for cellulitis of the penis. States finished all antibiotics but still swelling and painful. c/o nausea. Urinating.

## 2017-04-01 NOTE — ED Provider Notes (Signed)
[facility]-EMERGENCY DEPT Provider Note   CSN: [id number] Arrival date & time: 04/01/17  1253   By signing my name below, I, Clovis Pu, attest that this documentation has been prepared under the direction and in the presence of Bethann Berkshire, MD  Electronically Signed: Clovis Pu, ED Scribe. 04/01/17. 1:40 PM.   History   Chief Complaint Chief Complaint  Patient presents with  . Penis Pain    HPI Comments:  Charles Abbott is a 51 y.o. male who presents to the Emergency Department complaining of continued, gradually improving, mild penile pain x several days. Pt reports associated penile swelling. Per chart review, the pt was seen on 03/16/17 and was admitted to the hospital for cellulitis to his penis. He was discharged on 03/20/17 and prescribed hydrocodone for pain, cephalexin, Miralax and senokot. He states the hydrocodone makes him feel uneasy. Pt states he visited the ED on 03/25/17, has his catheter removed and was prescribed a 3 days course of antibiotics (Keflex). He notes he has been in contact with urology but notes they have not called him back regarding follow up. Pt states he has taken all of his medication without complete relief of his symptoms. Pt denies any other associated symptoms. No other complaints noted.   The history is provided by the patient. No language interpreter was used.  Penis Pain  This is a new problem. The current episode started more than 2 days ago. The problem occurs constantly. The problem has been gradually improving. Pertinent negatives include no headaches. Nothing aggravates the symptoms. Nothing relieves the symptoms. Treatments tried: hydrocodone  The treatment provided mild relief.    Past Medical History:  Diagnosis Date  . Cellulitis     Patient Active Problem List   Diagnosis Date Noted  . Cellulitis 03/16/2017  . Penile cellulitis 03/16/2017  . Generalized weakness 03/16/2017    Past Surgical History:  Procedure Laterality Date    . APPENDECTOMY     \  Home Medications    Prior to Admission medications   Medication Sig Start Date End Date Taking? Authorizing Provider  cephALEXin (KEFLEX) 500 MG capsule Take 1 capsule (500 mg total) by mouth 2 (two) times daily. 03/26/17   Burgess Amor, PA-C  HYDROcodone-acetaminophen (NORCO/VICODIN) 5-325 MG tablet Take 1-2 tablets by mouth every 6 (six) hours as needed for moderate pain. 03/19/17   Kathlen Mody, MD  polyethylene glycol (MIRALAX / GLYCOLAX) packet Take 17 g by mouth 2 (two) times daily. 03/19/17   Kathlen Mody, MD  senna-docusate (SENOKOT-S) 8.6-50 MG tablet Take 2 tablets by mouth 2 (two) times daily. 03/19/17   Kathlen Mody, MD    Family History History reviewed. No pertinent family history.  Social History Social History  Substance Use Topics  . Smoking status: Current Every Day Smoker    Packs/day: 1.00  . Smokeless tobacco: Never Used  . Alcohol use No     Allergies   Patient has no known allergies.   Review of Systems Review of Systems  Constitutional: Negative for appetite change and fatigue.  HENT: Negative for congestion, ear discharge and sinus pressure.   Eyes: Negative for discharge.  Respiratory: Negative for cough.   Genitourinary: Positive for penile pain and penile swelling. Negative for frequency and hematuria.  Musculoskeletal: Negative for back pain.  Skin: Negative for rash.  Neurological: Negative for seizures and headaches.  Psychiatric/Behavioral: Negative for hallucinations.     Physical Exam Updated Vital Signs BP 114/64 (BP Location: Right Arm)  Pulse 95   Temp 98.2 F (36.8 C) (Oral)   Resp 17   Ht  (1.88 m)   Wt 205 lb (93 kg)   SpO2 96%   BMI 26.32 kg/m   Physical Exam  Constitutional: He is oriented to person, place, and time. He appears well-developed.  HENT:  Head: Normocephalic.  Eyes: Conjunctivae are normal.  Neck: No tracheal deviation present.  Cardiovascular:  No murmur  heard. Genitourinary:  Genitourinary Comments: Distal 3rd of penis is swollen, mildly red and minimally tender.   Musculoskeletal: Normal range of motion.  Neurological: He is oriented to person, place, and time.  Skin: Skin is warm.  Psychiatric: He has a normal mood and affect.     ED Treatments / Results  DIAGNOSTIC STUDIES:  Oxygen Saturation is 96% on RA, normal by my interpretation.    COORDINATION OF CARE:  1:30 PM Discussed treatment plan with pt at bedside and pt agreed to plan.  Labs (all labs ordered are listed, but only abnormal results are displayed) Labs Reviewed - No data to display  EKG  EKG Interpretation None       Radiology No results found.  Procedures Procedures (including critical care time)  Medications Ordered in ED Medications - No data to display   Initial Impression / Assessment and Plan / ED Course  I have reviewed the triage vital signs and the nursing notes.  Pertinent labs & imaging results that were available during my care of the patient were reviewed by me and considered in my medical decision making (see chart for details).    Patient with cellulitis to his penis. It seems to be improving. I will place him back on the antibiotics he was on Keflex and Bactrim for another week. He is also given some pain medicines. He has been made an appointment with urology on Friday the 13th at 9 AM. Patient understands he must be there  Final Clinical Impressions(s) / ED Diagnoses   Final diagnoses:  None    New Prescriptions New Prescriptions   No medications on file  The chart was scribed for me under my direct supervision.  I personally performed the history, physical, and medical decision making and all procedures in the evaluation of this patient.Bethann Berkshire, MD 04/01/17 [phone]

## 2017-04-01 NOTE — Discharge Instructions (Signed)
Follow up Friday at 9am at [facility] urology [place]

## 2017-04-03 ENCOUNTER — Ambulatory Visit (INDEPENDENT_AMBULATORY_CARE_PROVIDER_SITE_OTHER): Payer: Self-pay | Admitting: Urology

## 2017-04-03 DIAGNOSIS — N35111 Postinfective urethral stricture, not elsewhere classified, male, meatal: Secondary | ICD-10-CM

## 2017-04-03 DIAGNOSIS — N4889 Other specified disorders of penis: Secondary | ICD-10-CM

## 2017-04-09 ENCOUNTER — Encounter (HOSPITAL_COMMUNITY): Payer: Self-pay | Admitting: *Deleted

## 2017-04-09 ENCOUNTER — Emergency Department (HOSPITAL_COMMUNITY)
Admission: EM | Admit: 2017-04-09 | Discharge: 2017-04-09 | Disposition: A | Discharge status: Home / Self Care | Payer: Self-pay | Attending: Dermatology | Admitting: Dermatology

## 2017-04-09 DIAGNOSIS — L0390 Cellulitis, unspecified: Secondary | ICD-10-CM | POA: Insufficient documentation

## 2017-04-09 DIAGNOSIS — F17210 Nicotine dependence, cigarettes, uncomplicated: Secondary | ICD-10-CM | POA: Insufficient documentation

## 2017-04-09 DIAGNOSIS — Z5321 Procedure and treatment not carried out due to patient leaving prior to being seen by health care provider: Secondary | ICD-10-CM | POA: Insufficient documentation

## 2017-04-09 HISTORY — DX: Other specified health status: Z78.9

## 2017-04-09 NOTE — ED Notes (Addendum)
Called for room with no response 

## 2017-04-09 NOTE — ED Triage Notes (Addendum)
Pt c/o redness and edema to penis due to cellulitis that started on 03/16/17. Pt has been on IV and oral antibiotics but the swelling and edema gets worse when the he finishes the course of antibiotics. Pt self caths 3x daily and has been doing so as usual. No issues with urination. Denies fever and penile discharge.

## 2017-04-09 NOTE — ED Notes (Signed)
Pt called for room with no response.  °

## 2017-05-08 ENCOUNTER — Ambulatory Visit: Payer: Self-pay | Admitting: Urology

## 2021-06-18 DIAGNOSIS — R079 Chest pain, unspecified: Secondary | ICD-10-CM | POA: Insufficient documentation

## 2021-08-02 DIAGNOSIS — I2510 Atherosclerotic heart disease of native coronary artery without angina pectoris: Secondary | ICD-10-CM | POA: Insufficient documentation

## 2021-08-02 DIAGNOSIS — K219 Gastro-esophageal reflux disease without esophagitis: Secondary | ICD-10-CM | POA: Insufficient documentation

## 2021-08-02 DIAGNOSIS — I10 Essential (primary) hypertension: Secondary | ICD-10-CM | POA: Insufficient documentation

## 2021-08-02 DIAGNOSIS — E039 Hypothyroidism, unspecified: Secondary | ICD-10-CM | POA: Insufficient documentation

## 2021-08-02 DIAGNOSIS — J449 Chronic obstructive pulmonary disease, unspecified: Secondary | ICD-10-CM | POA: Insufficient documentation

## 2021-08-02 DIAGNOSIS — Z951 Presence of aortocoronary bypass graft: Secondary | ICD-10-CM | POA: Insufficient documentation

## 2021-08-02 DIAGNOSIS — E7800 Pure hypercholesterolemia, unspecified: Secondary | ICD-10-CM | POA: Insufficient documentation

## 2022-01-31 DIAGNOSIS — K5900 Constipation, unspecified: Secondary | ICD-10-CM | POA: Insufficient documentation

## 2022-01-31 DIAGNOSIS — E785 Hyperlipidemia, unspecified: Secondary | ICD-10-CM | POA: Insufficient documentation

## 2022-01-31 DIAGNOSIS — I214 Non-ST elevation (NSTEMI) myocardial infarction: Secondary | ICD-10-CM | POA: Insufficient documentation

## 2022-02-17 DIAGNOSIS — I25722 Atherosclerosis of autologous artery coronary artery bypass graft(s) with refractory angina pectoris: Secondary | ICD-10-CM | POA: Insufficient documentation

## 2022-04-30 ENCOUNTER — Emergency Department (HOSPITAL_COMMUNITY)
Admission: EM | Admit: 2022-04-30 | Discharge: 2022-04-30 | Disposition: A | Discharge status: Home Health | Payer: Self-pay | Source: Home / Self Care

## 2022-05-10 ENCOUNTER — Emergency Department (HOSPITAL_COMMUNITY)
Admission: EM | Admit: 2022-05-10 | Discharge: 2022-05-10 | Disposition: A | Discharge status: Home / Self Care | Payer: Self-pay | Attending: Emergency Medicine | Admitting: Emergency Medicine

## 2022-05-10 ENCOUNTER — Emergency Department (HOSPITAL_COMMUNITY): Payer: Self-pay

## 2022-05-10 ENCOUNTER — Other Ambulatory Visit: Payer: Self-pay

## 2022-05-10 ENCOUNTER — Encounter (HOSPITAL_COMMUNITY): Payer: Self-pay

## 2022-05-10 DIAGNOSIS — F1510 Other stimulant abuse, uncomplicated: Secondary | ICD-10-CM | POA: Insufficient documentation

## 2022-05-10 DIAGNOSIS — Z20822 Contact with and (suspected) exposure to covid-19: Secondary | ICD-10-CM | POA: Insufficient documentation

## 2022-05-10 DIAGNOSIS — R739 Hyperglycemia, unspecified: Secondary | ICD-10-CM | POA: Insufficient documentation

## 2022-05-10 DIAGNOSIS — Z7901 Long term (current) use of anticoagulants: Secondary | ICD-10-CM | POA: Insufficient documentation

## 2022-05-10 DIAGNOSIS — I25810 Atherosclerosis of coronary artery bypass graft(s) without angina pectoris: Secondary | ICD-10-CM | POA: Insufficient documentation

## 2022-05-10 DIAGNOSIS — R404 Transient alteration of awareness: Secondary | ICD-10-CM | POA: Insufficient documentation

## 2022-05-10 DIAGNOSIS — F1990 Other psychoactive substance use, unspecified, uncomplicated: Secondary | ICD-10-CM

## 2022-05-10 DIAGNOSIS — Z79899 Other long term (current) drug therapy: Secondary | ICD-10-CM | POA: Insufficient documentation

## 2022-05-10 HISTORY — DX: Essential (primary) hypertension: I10

## 2022-05-10 HISTORY — DX: Acute myocardial infarction, unspecified: I21.9

## 2022-05-10 LAB — URINALYSIS, ROUTINE W REFLEX MICROSCOPIC
Bacteria, UA: NONE SEEN
Bilirubin Urine: NEGATIVE
Glucose, UA: NEGATIVE mg/dL
Ketones, ur: NEGATIVE mg/dL
Leukocytes,Ua: NEGATIVE
Nitrite: NEGATIVE
Protein, ur: NEGATIVE mg/dL
Specific Gravity, Urine: 1.008 (ref 1.005–1.030)
pH: 6 (ref 5.0–8.0)

## 2022-05-10 LAB — RAPID URINE DRUG SCREEN, HOSP PERFORMED
Amphetamines: POSITIVE — AB
Barbiturates: NOT DETECTED
Benzodiazepines: NOT DETECTED
Cocaine: POSITIVE — AB
Opiates: POSITIVE — AB
Tetrahydrocannabinol: NOT DETECTED

## 2022-05-10 LAB — APTT: aPTT: 30 seconds (ref 24–36)

## 2022-05-10 LAB — ETHANOL: Alcohol, Ethyl (B): <10 mg/dL (ref <10)

## 2022-05-10 LAB — CBC
HCT: 43.9 % (ref 39.0–52.0)
Hemoglobin: 15.2 g/dL (ref 13.0–17.0)
MCH: 33.2 pg (ref 26.0–34.0)
MCHC: 34.6 g/dL (ref 30.0–36.0)
MCV: 95.9 fL (ref 80.0–100.0)
Platelets: 238 10*3/uL (ref 150–400)
RBC: 4.58 MIL/uL (ref 4.22–5.81)
RDW: 13.4 % (ref 11.5–15.5)
WBC: 5.9 10*3/uL (ref 4.0–10.5)
nRBC: 0 % (ref 0.0–0.2)

## 2022-05-10 LAB — DIFFERENTIAL
Abs Immature Granulocytes: 0.03 10*3/uL (ref 0.00–0.07)
Basophils Absolute: 0.1 10*3/uL (ref 0.0–0.1)
Basophils Relative: 1 %
Eosinophils Absolute: 0.2 10*3/uL (ref 0.0–0.5)
Eosinophils Relative: 4 %
Immature Granulocytes: 1 %
Lymphocytes Relative: 29 %
Lymphs Abs: 1.7 10*3/uL (ref 0.7–4.0)
Monocytes Absolute: 0.6 10*3/uL (ref 0.1–1.0)
Monocytes Relative: 10 %
Neutro Abs: 3.3 10*3/uL (ref 1.7–7.7)
Neutrophils Relative %: 55 %

## 2022-05-10 LAB — COMPREHENSIVE METABOLIC PANEL
ALT: 19 U/L (ref 0–44)
AST: 20 U/L (ref 15–41)
Albumin: 3.9 g/dL (ref 3.5–5.0)
Alkaline Phosphatase: 89 U/L (ref 38–126)
Anion gap: 7 (ref 5–15)
BUN: 15 mg/dL (ref 6–20)
CO2: 24 mmol/L (ref 22–32)
Calcium: 9 mg/dL (ref 8.9–10.3)
Chloride: 106 mmol/L (ref 98–111)
Creatinine, Ser: 0.88 mg/dL (ref 0.61–1.24)
GFR, Estimated: >60 mL/min (ref >60)
Glucose, Bld: 98 mg/dL (ref 70–99)
Potassium: 3.4 mmol/L — ABNORMAL LOW (ref 3.5–5.1)
Sodium: 137 mmol/L (ref 135–145)
Total Bilirubin: 0.3 mg/dL (ref 0.3–1.2)
Total Protein: 6.9 g/dL (ref 6.5–8.1)

## 2022-05-10 LAB — RESP PANEL BY RT-PCR (FLU A&B, COVID) ARPGX2
Influenza A by PCR: NEGATIVE
Influenza B by PCR: NEGATIVE
SARS Coronavirus 2 by RT PCR: NEGATIVE

## 2022-05-10 LAB — CBG MONITORING, ED: Glucose-Capillary: 114 mg/dL — ABNORMAL HIGH (ref 70–99)

## 2022-05-10 LAB — PROTIME-INR
INR: 1 (ref 0.8–1.2)
Prothrombin Time: 12.7 seconds (ref 11.4–15.2)

## 2022-05-10 LAB — TROPONIN I (HIGH SENSITIVITY): Troponin I (High Sensitivity): 7 ng/L (ref <18)

## 2022-05-10 LAB — CK: Total CK: 164 U/L (ref 49–397)

## 2022-05-10 MED ORDER — CLOPIDOGREL BISULFATE 75 MG PO TABS: 75.0000 mg | ORAL_TABLET | Freq: Every day | ORAL | 3 refills | Status: AC

## 2022-05-10 NOTE — ED Notes (Signed)
ED Provider at bedside. 

## 2022-05-10 NOTE — Discharge Instructions (Signed)
Substance Abuse Treatment Programs ° °Intensive Outpatient Programs °[facility]     °[address]      °[place], [place]                   °[phone]      ° °[facility] °[address] °[place], [place] °[phone] ° °[facility]     °(Inpatient and outpatient)     °[address]           °[phone]   ° °[facility] °[phone] (Suboxone and Methadone) ° °[address]      °[place], [place] [address]      °[phone]      ° °[address] °[place], [place] °852-3033 ° °[facility] (Outpatient/Inpatient, Chemical)    °(insurance only) [phone]      °       °[facility] (Groups & Residential) °[place], [place] °[phone] ° °   °[facility]     °[address]     °[place], [place]      °[phone]      ° °[facility] (for caregivers and family) °[address] °[place], [place] °336-299-4655 ° ° ° ° ° °Residential Treatment Programs °[facility]      °[address], [place], [place] [address]  °[phone]      ° °[facility] °[address] [place], [place] [address] °[phone] ° °[facility]        °336-248-8914      ° °[facility] °[phone] ° °[facility]             °[address]                                         °[place], [place]                                                °[phone] or 336-784-9470                              ° °[facility] [place] °[address] °[place], [address] °[phone] ° °[facility]    °[address]      °[place], [place]     °[phone]      ° °[facility] °[address] °, [place] °336-285-9073 ° °[facility]   °[address]     °[place], [place] [address]     °[phone]      °Admissions: 8am-3pm M-F ° °[facility] °[address] °[place],  [place] °336-227-7417 ° °[facility]: Residential Program (90 Days)   °[place], [place]      °336-725-8389 or 800-758-6077    ° °[facility] °[place],  °(Walk in Hours over the weekend or by referral) ° °[facility] °[address], [place],  [address] °([phone] ° °Crisis Mobile: [facility]:  1-877-626-1772 (for crisis response 24 hours a day) °[facility] Hotline:      1-800-256-2452 °Outpatient Psychiatry and Counseling ° °[facility]: Mobile Crisis   Management 24 hours:  1-877-626-1772 ° °[facility] sliding scale fee and walk in schedule: M-F 8am-12pm/1pm-3pm °[address]  °[place], [place] [address] °[phone] ° °[facility] °[address] °[place], [place] [address] °336-703-9650 ° °[facility] ([facility] new patient walk-in appointments available Monday - Friday 8am -3pm.          °[address] °[place], [place] [address] °336-676-6840 or crisis line- 336-676-6905 ° °[facility] Intensive Outpatient Therapy Program °[address] °[place], [place] [address] °336-832-9804 ° °[facility]                  °Crisis Services      °[phone]      °[address]     °[place], [place] [address]                ° °[facility]   °[facility] °[phone] °[address] °[place], [place] [address] ° ° °[facility]          °[address]  °[place], [place] [address]       °[phone] ° °[facility] °[address] °[place], [place] [address] °336-292-1510 ° °[facility]    °[address]    °[place], [place] [address]     °[phone]      ° °Parish McKinney, MD     °[address]     °[place] [address]     °336-282-1251     °  °[facility] °[address] °[place] [address] ° °[facility]     °[address]     °[place], [place]      °336-542-2076      ° °[facility] °Shamsher Ahluwalia, MD °[address] °[place], [place] [address] °[phone] ° °[facility]     °[address]     °[place], [place] [address]     °336-274-1237      ° °[facility] °[address] °[place], [place] [address] °336-854-4450 °Resources for Temporary Residential Assistance/Crisis Centers ° °DAY CENTERS °[facility] °M-F 8am-3pm   °[address] [place], [place] [address]   336-332-0824 °Services include: laundry, barbering, support groups, case management, phone  & computer access, showers, AA/NA mtgs, mental health/substance abuse nurse, job skills class, disability information, VA assistance, spiritual classes, etc.  ° °HOMELESS SHELTERS ° °[facility]     °[facility]   °[address], [place]     °[phone]       °       °[facility] (women and children)       °[address] °[place], [place] [address] °336-275-0820 °[email or web address] for application and process °Application Required ° °[facility]   °[address]    °[place] [address]     °[phone]       °             °[facility] °[address] °[place], [place] [address] °336.273.5572 °336-235-0363(schedule application appt.) °Application Required ° °[facility] (women only)    °[address]     °[place], [place] [address]     °336-884-1039      °  Intake starts 6pm daily °Need valid ID, SSC, & Police report °[facility] [place] °[address] °[place], [place] °336-881-5420 °Application Required ° °[facility] (men only)     °[address]      °[place], [place]     °[phone]      ° °[facility] °(Pregnant women only) °[address] °[place], [place] °336-275-0206 ° °[facility]      °[address]      °[place], [place] [address]     °336-722-9951      °       °[facility] °[address] °[place], [place] °336-723-1848 °90 day commitment/SA/Application process ° °[facility](men only)     °[address]     °[place], [place]     °[phone]       °Check-in at 7pm     °       °[facility] °[address] °[place], [place] [address] °336-248-6684 °Men/Women/Women and Children must be there by 7 pm ° °[facility] °[place],  °336-722-8721                ° °

## 2022-05-10 NOTE — ED Provider Notes (Signed)
[facility]Y [facility] Provider Note   CSN: [id number] Arrival date & time: 05/10/22  0250     History  Chief Complaint  Patient presents with   Altered Mental Status   Level 5 caveat due to acuity of condition Charles Abbott is a 56 y.o. male.  The history is provided by the EMS personnel, a relative and the patient.  Altered Mental Status Presenting symptoms: confusion   Severity:  Moderate Timing:  Constant Progression:  Improving Chronicity:  New Patient with history of CAD presents with altered mental status.  EMS reports patient was found on the floor.  It is unclear from their story when this all began.  After initial history, his mother-in-law arrived.  She reports that he has not been himself for "3 weeks " She reports he has had intermittent confusion.  He was last seen on May 19 around 1730 but appeared confused at that time. Later on in the night he was found in the floor with altered mental status. He has been without his Plavix for over a week He recently relocated to the area  Home Medications Prior to Admission medications   Medication Sig Start Date End Date Taking? Authorizing Provider  albuterol (VENTOLIN HFA) 108 (90 Base) MCG/ACT inhaler Inhale into the lungs. 10/12/21  Yes [provider]  aspirin EC 81 MG tablet Take 1 tablet by mouth daily. 10/12/21  Yes [provider]  atorvastatin (LIPITOR) 80 MG tablet Take by mouth. 10/12/21  Yes [provider]  calcium carbonate (OS-CAL) 1250 (500 Ca) MG chewable tablet Chew by mouth. 08/12/21  Yes [provider]  clobetasol cream (TEMOVATE) 0.05 % Apply topically 2 (two) times daily. 08/12/21  Yes [provider]  clopidogrel (PLAVIX) 75 MG tablet Take 1 tablet (75 mg total) by mouth daily. 05/10/22  Yes Zadie RhineWickline, Oletha Tolson, MD  ezetimibe (ZETIA) 10 MG tablet Take by mouth. 08/12/21  Yes [provider]  famotidine (PEPCID) 20 MG tablet Take by mouth.  01/27/22  Yes [provider]  Fluticasone Furoate (ARNUITY ELLIPTA) 100 MCG/ACT AEPB Inhale into the lungs. 10/25/21  Yes [provider]  gabapentin (NEURONTIN) 100 MG capsule Take by mouth. 01/27/22  Yes [provider]  levothyroxine (SYNTHROID) 25 MCG tablet Take by mouth. 10/12/21  Yes [provider]  magnesium oxide (MAG-OX) 400 MG tablet Take by mouth. 08/12/21  Yes [provider]  Menthol-Methyl Salicylate (THERA-GESIC) 0.5-15 % CREA Apply topically. 08/12/21  Yes [provider]  Metoprolol Tartrate 75 MG TABS Take by mouth. 01/01/22 01/01/23 Yes [provider]  nitroGLYCERIN (NITROSTAT) 0.4 MG SL tablet Place under the tongue. 08/12/21 02/01/23 Yes [provider]  sodium chloride (OCEAN) 0.65 % nasal spray Use 2 sprays into each nostril four (4) times a day as needed for congestion (dry nasal). 08/12/21  Yes [provider]  aspirin 81 MG chewable tablet Chew by mouth.    [provider]  calcium carbonate (OS-CAL) 1250 (500 Ca) MG chewable tablet Chew by mouth.    [provider]  metoprolol tartrate (LOPRESSOR) 50 MG tablet Take by mouth.    [provider]  polyethylene glycol (MIRALAX / GLYCOLAX) packet Take 17 g by mouth 2 (two) times daily. 03/19/17   Kathlen ModyAkula, Vijaya, MD  senna-docusate (SENOKOT-S) 8.6-50 MG tablet Take 2 tablets by mouth 2 (two) times daily. Patient not taking: Reported on 04/01/2017 03/19/17   Kathlen ModyAkula, Vijaya, MD      Allergies  Patient has no known allergies.    Review of Systems   Review of Systems  Unable to perform ROS: Acuity of condition  Psychiatric/Behavioral:  Positive for confusion.    Physical Exam Updated Vital Signs BP 124/82   Pulse 69   Temp 97.6 F (36.4 C) (Rectal)   Resp 20   Ht 1.88 m ( )   Wt 106.6 kg   SpO2 97%   BMI 30.17 kg/m  Physical Exam CONSTITUTIONAL: Disheveled, no acute distress HEAD: Normocephalic/atraumatic, no  visible trauma EYES: EOMI/PERRL, no nystagmus ENMT: Mucous membranes moist NECK: supple no meningeal signs SPINE/BACK:entire spine nontender, no bruising/crepitance/stepoffs noted to spine CV: S1/S2 noted, no murmurs/rubs/gallops noted LUNGS: Lungs are clear to auscultation bilaterally, no apparent distress ABDOMEN: soft, nontender NEURO: Pt is awake/alert, patient is slow to respond but answers questions appropriately.  There is no facial droop.  No arm or leg drift. EXTREMITIES: pulses normal/equal, full ROM, no deformities SKIN: warm, color normal PSYCH: Unable to assess ED Results / Procedures / Treatments   Labs (all labs ordered are listed, but only abnormal results are displayed) Labs Reviewed  COMPREHENSIVE METABOLIC PANEL - Abnormal; Notable for the following components:      Result Value   Potassium 3.4 (*)    All other components within normal limits  RAPID URINE DRUG SCREEN, HOSP PERFORMED - Abnormal; Notable for the following components:   Opiates POSITIVE (*)    Cocaine POSITIVE (*)    Amphetamines POSITIVE (*)    All other components within normal limits  URINALYSIS, ROUTINE W REFLEX MICROSCOPIC - Abnormal; Notable for the following components:   Color, Urine STRAW (*)    Hgb urine dipstick SMALL (*)    All other components within normal limits  CBG MONITORING, ED - Abnormal; Notable for the following components:   Glucose-Capillary 114 (*)    All other components within normal limits  RESP PANEL BY RT-PCR (FLU A&B, COVID) ARPGX2  ETHANOL  PROTIME-INR  APTT  CBC  DIFFERENTIAL  CK  TROPONIN I (HIGH SENSITIVITY)    EKG EKG Interpretation  Date/Time:  Saturday May 10 2022 03:04:02 EDT Ventricular Rate:  62 PR Interval:  148 QRS Duration: 121 QT Interval:  445 QTC Calculation: 452 R Axis:   62 Text Interpretation: Sinus rhythm IVCD, consider atypical RBBB Inferior infarct, old No previous ECGs available Interpretation limited secondary to artifact  Confirmed by Zadie Rhine ([id number]) on 05/10/2022 3:27:49 AM  Radiology CT HEAD WO CONTRAST  Result Date: 05/10/2022 CLINICAL DATA:  Neck trauma, intoxicated or obtunded (Age >= 16y); Delirium. Altered mental status, delirium. Found down. EXAM: CT HEAD WITHOUT CONTRAST CT CERVICAL SPINE WITHOUT CONTRAST TECHNIQUE: Multidetector CT imaging of the head and cervical spine was performed following the standard protocol without intravenous contrast. Multiplanar CT image reconstructions of the cervical spine were also generated. RADIATION DOSE REDUCTION: This exam was performed according to the departmental dose-optimization program which includes automated exposure control, adjustment of the mA and/or kV according to patient size and/or use of iterative reconstruction technique. COMPARISON:  None Available. FINDINGS: CT HEAD FINDINGS Brain: Normal anatomic configuration. No abnormal intra or extra-axial mass lesion or fluid collection. No abnormal mass effect or midline shift. No evidence of acute intracranial hemorrhage or infarct. Ventricular size is normal. Cerebellum unremarkable. Vascular: Unremarkable Skull: Intact Sinuses/Orbits: Paranasal sinuses are clear. Orbits are unremarkable. Other: Mastoid air cells and middle ear cavities are clear. CT CERVICAL SPINE FINDINGS Alignment: There is mild reversal the normal cervical lordosis.  No listhesis. Skull base and vertebrae: Craniocervical alignment is normal. The atlantodental interval is not widened. 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 No acute fracture of the cervical spine. Soft tissues and spinal canal: No prevertebral fluid or swelling. No visible canal hematoma. Disc levels: There is intervertebral disc space narrowing and endplate remodeling throughout the cervical spine, most severe at C5-6 in keeping with changes of moderate degenerative disc disease. Prevertebral soft tissues are not  thickened. No significant canal stenosis. Uncovertebral arthrosis results in moderate bilateral neuroforaminal narrowing at C5-6. Mild bilateral neuroforaminal narrowing noted at C3-4. Upper chest: Unremarkable Other: None IMPRESSION: No acute intracranial injury.  No calvarial fracture. No acute fracture or listhesis of the cervical spine. Electronically Signed   By: Helyn Numbers M.D.   On: 05/10/2022 04:15   CT Cervical Spine Wo Contrast  Result Date: 05/10/2022 CLINICAL DATA:  Neck trauma, intoxicated or obtunded (Age >= 16y); Delirium. Altered mental status, delirium. Found down. EXAM: CT HEAD WITHOUT CONTRAST CT CERVICAL SPINE WITHOUT CONTRAST TECHNIQUE: Multidetector CT imaging of the head and cervical spine was performed following the standard protocol without intravenous contrast. Multiplanar CT image reconstructions of the cervical spine were also generated. RADIATION DOSE REDUCTION: This exam was performed according to the departmental dose-optimization program which includes automated exposure control, adjustment of the mA and/or kV according to patient size and/or use of iterative reconstruction technique. COMPARISON:  None Available. FINDINGS: CT HEAD FINDINGS Brain: Normal anatomic configuration. No abnormal intra or extra-axial mass lesion or fluid collection. No abnormal mass effect or midline shift. No evidence of acute intracranial hemorrhage or infarct. Ventricular size is normal. Cerebellum unremarkable. Vascular: Unremarkable Skull: Intact Sinuses/Orbits: Paranasal sinuses are clear. Orbits are unremarkable. Other: Mastoid air cells and middle ear cavities are clear. CT CERVICAL SPINE FINDINGS Alignment: There is mild reversal the normal cervical lordosis. No listhesis. Skull base and vertebrae: Craniocervical alignment is normal. The atlantodental interval is not widened. 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 No acute  fracture of the cervical spine. Soft tissues and spinal canal: No prevertebral fluid or swelling. No visible canal hematoma. Disc levels: There is intervertebral disc space narrowing and endplate remodeling throughout the cervical spine, most severe at C5-6 in keeping with changes of moderate degenerative disc disease. Prevertebral soft tissues are not thickened. No significant canal stenosis. Uncovertebral arthrosis results in moderate bilateral neuroforaminal narrowing at C5-6. Mild bilateral neuroforaminal narrowing noted at C3-4. Upper chest: Unremarkable Other: None IMPRESSION: No acute intracranial injury.  No calvarial fracture. No acute fracture or listhesis of the cervical spine. Electronically Signed   By: Helyn Numbers M.D.   On: 05/10/2022 04:15   DG Chest Port 1 View  Result Date: 05/10/2022 CLINICAL DATA:  Weakness EXAM: PORTABLE CHEST 1 VIEW COMPARISON:  None Available. FINDINGS: Lungs are clear save for a benign calcified granuloma within the left mid lung zone. No pneumothorax or pleural effusion. Coronary artery bypass grafting has been performed. Cardiac size within normal limits. Pulmonary vascularity is normal. No acute bone abnormality. IMPRESSION: No active disease. Electronically Signed   By: Helyn Numbers M.D.   On: 05/10/2022 03:36    Procedures Procedures    Medications Ordered in ED Medications - No data to display  ED Course/ Medical Decision Making/ A&P Clinical Course as of 05/10/22 0454  Sat May 10, 2022  1610 Patient presents for altered  mental status.  On my assessment he is awake and alert but slow to respond.  He has no gross neurodeficits.  Family reports he is being acting weak for several weeks.  This is not a code stroke [DW]  0424 COCAINE(!): POSITIVE Urine drug screen positive for cocaine/amphetamine/opiates [DW]  0452 Patient appears back to baseline.  He is awake and alert at this time.  We discussed all of his findings including CT head and labs.  He  preferred to have his son at bedside to discuss all the findings. [DW]  [phone] I informed patient that his urine drug screen was positive for opiates, amphetamines and cocaine.  He admits to taking hydrocodone yesterday as well as smoking crack cocaine and trying methamphetamine.  He reports he has been "clean" from crack for quite some time.  He reports is the first time he has used since his heart bypass surgery [DW]  [phone] Patient reports he plans to quit using substances and will stick to his medical regimen.  He does need refill of his Plavix as he is run out for the past 4 days.  He reports he has all of his other medicines.  He plans to relocate to the [place] area. [DW]  0454 At this point patient appears safe for discharge home.  No signs of acute neurologic emergency.  Low suspicion for stroke at this time.  Suspect his change in mental status was due to underlying substance use tonight [DW]    Clinical Course User Index [DW] Zadie Rhine, MD                           Medical Decision Making Amount and/or Complexity of Data Reviewed Labs: ordered. Decision-making details documented in ED Course. Radiology: ordered.  Risk Prescription drug management.   This patient presents to the ED for concern of altered mental status, this involves an extensive number of treatment options, and is a complaint that carries with it a high risk of complications and morbidity.  The differential diagnosis includes but is not limited to CVA, sepsis, meningitis, electrolyte abnormality, drug overdose, alcohol intoxication  Comorbidities that complicate the patient evaluation: Patient's presentation is complicated by their history of coronary artery disease  Social Determinants of Health: Patient's lack of insurance and impaired access to primary care  increases the complexity of managing their presentation  Additional history obtained: Additional history obtained from family Records reviewed Care  Everywhere/External Records  Lab Tests: I Ordered, and personally interpreted labs.  The pertinent results include: Mild hyperglycemia  Imaging Studies ordered: I ordered imaging studies including CT scan head and C-spine and X-ray chest   I independently visualized and interpreted imaging which showed no acute findings I agree with the radiologist interpretation  Cardiac Monitoring: The patient was maintained on a cardiac monitor.  I personally viewed and interpreted the cardiac monitor which showed an underlying rhythm of:  sinus rhythm  Reevaluation: After the interventions noted above, I reevaluated the patient and found that they have :improved  Complexity of problems addressed: Patient's presentation is most consistent with  acute presentation with potential threat to life or bodily function  Disposition: After consideration of the diagnostic results and the patient's response to treatment,  I feel that the patent would benefit from discharge   .           Final Clinical Impression(s) / ED Diagnoses Final diagnoses:  Transient alteration of awareness  Substance use disorder  Coronary artery disease involving coronary bypass graft of native heart without angina pectoris    Rx / DC Orders ED Discharge Orders          Ordered    clopidogrel (PLAVIX) 75 MG tablet  Daily        05/10/22 0451    Ambulatory referral to Cardiology       Comments: If you have not heard from the Cardiology office within the next 72 hours please call [phone].   05/10/22 3329              Zadie Rhine, MD 05/10/22 [phone]

## 2022-05-10 NOTE — ED Triage Notes (Signed)
Pt arrived via [facility] from home who responded when family called due to Pts altered mental status. Pt confused to time and situation. Per [facility], Pt was on floor unresponsive upon their arrival. Pt and family report Pt has been out of Plavix for over a week and had CABG 7-9 months ago. Family report Pts LKW @1730  yesterday.

## 2022-05-10 NOTE — ED Notes (Signed)
Pt asked RN to speak to EDP reporting he would like to go home. RN asked Pt if he would like to attempt ambulation around Nurse Station to assess Pts gait and stability. Pt declined endorsing "I feel too weak right now." EDP notified. Family currently sitting at bedside.

## 2022-05-15 ENCOUNTER — Ambulatory Visit (INDEPENDENT_AMBULATORY_CARE_PROVIDER_SITE_OTHER): Payer: Self-pay | Admitting: Cardiology

## 2022-05-15 ENCOUNTER — Encounter: Payer: Self-pay | Admitting: Cardiology

## 2022-05-15 VITALS — BP 106/68 | HR 71 | Ht 74.0 in | Wt 234.4 lb

## 2022-05-15 DIAGNOSIS — E785 Hyperlipidemia, unspecified: Secondary | ICD-10-CM

## 2022-05-15 DIAGNOSIS — Z720 Tobacco use: Secondary | ICD-10-CM

## 2022-05-15 DIAGNOSIS — F1910 Other psychoactive substance abuse, uncomplicated: Secondary | ICD-10-CM

## 2022-05-15 DIAGNOSIS — E039 Hypothyroidism, unspecified: Secondary | ICD-10-CM

## 2022-05-15 DIAGNOSIS — I10 Essential (primary) hypertension: Secondary | ICD-10-CM

## 2022-05-15 DIAGNOSIS — I2510 Atherosclerotic heart disease of native coronary artery without angina pectoris: Secondary | ICD-10-CM

## 2022-05-15 MED ORDER — NICOTINE POLACRILEX 4 MG MT GUM: 4.0000 mg | CHEWING_GUM | OROMUCOSAL | 0 refills | Status: AC | PRN

## 2022-05-15 NOTE — Patient Instructions (Signed)
Medication Instructions:  Use nicorette as directed  *If you need a refill on your cardiac medications before your next appointment, please call your pharmacy*  Follow-Up: At [facility], you and your health needs are our priority.  As part of our continuing mission to provide you with exceptional heart care, we have created designated Provider Care Teams.  These Care Teams include your primary Cardiologist (physician) and Advanced Practice Providers (APPs -  Physician Assistants and Nurse Practitioners) who all work together to provide you with the care you need, when you need it.  We recommend signing up for the patient portal called "MyChart".  Sign up information is provided on this After Visit Summary.  MyChart is used to connect with patients for Virtual Visits (Telemedicine).  Patients are able to view lab/test results, encounter notes, upcoming appointments, etc.  Non-urgent messages can be sent to your provider as well.   To learn more about what you can do with MyChart, go to [email or web address].    Your next appointment:   Next available with pharmacist [facility]  3 months with Dr. Gardiner Rhyme   Important Information About Sugar

## 2022-05-15 NOTE — Progress Notes (Signed)
Cardiology Office Note:    Date:  05/15/2022   ID:  Charles Levyony L Golberg, DOB 06/29/1966, MRN [id number]  PCP:  Patient, No Pcp Per (Inactive)  Cardiologist:  None  Electrophysiologist:  None   Referring MD: Zadie RhineWickline, Donald, MD   Chief Complaint  Patient presents with   Coronary Artery Disease    History of Present Illness:    Charles Abbott is a 56 y.o. male with a hx of CAD status post CABG (LIMA-LAD, SVG-RCA, SVG-OM1) on 07/2021, COPD, hypertension, hyperlipidemia, tobacco use, hypothyroidism who presents as an ED follow-up.  Had NSTEMI 01/30/2022 with cath showing 95% LCx stenosis extending into left main, underwent PCI.  Seen in the ED on 05/10/2022 with altered mental status.  UDS positive for cocaine, amphetamines, and opiates.  He reports that he took hydrocodone, smoked crack cocaine and tried methamphetamine.  Stated this was the first time he had used since his heart bypass surgery.  Reports he recently moved to the area, [facility]  He reports that he had been off Plavix for about 5 days prior to his ED visit.  Since the ED visit he has been taking Plavix but has not been taking his other medications.  States that he has a lot of meds at home but is not sure what he is supposed to be taking.  He is currently staying with his ex-wife and mother-in-law has been doing a lot of work around the house, denies any exertional chest pain or dyspnea.  Continues to smoke 1 pack/day.   Past Medical History:  Diagnosis Date   Cellulitis    Hypertension    Myocardial infarct [facility](HCC)    Self-catheterizes urinary bladder    pt had urethral trauma from removal of foley after appendectomy     Past Surgical History:  Procedure Laterality Date   APPENDECTOMY     CORONARY ARTERY BYPASS GRAFT      Current Medications: Current Meds  Medication Sig   clopidogrel (PLAVIX) 75 MG tablet Take 1 tablet (75 mg total) by mouth daily.   nicotine polacrilex (NICORETTE) 4  MG gum Take 1 each (4 mg total) by mouth as needed for smoking cessation.     Allergies:   Patient has no known allergies.   Social History   Socioeconomic History   Marital status: Divorced    Spouse name: Not on file   Number of children: Not on file   Years of education: Not on file   Highest education level: Not on file  Occupational History   Not on file  Tobacco Use   Smoking status: Every Day    Packs/day: 1.00    Types: Cigarettes   Smokeless tobacco: Never  Vaping Use   Vaping Use: Never used  Substance and Sexual Activity   Alcohol use: No   Drug use: No   Sexual activity: Not on file  Other Topics Concern   Not on file  Social History Narrative   Not on file   Social Determinants of Health   Financial Resource Strain: Not on file  Food Insecurity: Not on file  Transportation Needs: Not on file  Physical Activity: Not on file  Stress: Not on file  Social Connections: Not on file     Family History: The patient's family history is not on file.  ROS:   Please see the history of present illness.     All other systems reviewed and are negative.  EKGs/Labs/Other Studies  Reviewed:    The following studies were reviewed today:   EKG:   05/15/2022: Normal sinus rhythm, rate 71, incomplete right bundle branch block  Recent Labs: 05/10/2022: ALT 19; BUN 15; Creatinine, Ser 0.88; Hemoglobin 15.2; Platelets 238; Potassium 3.4; Sodium 137  Recent Lipid Panel No results found for: CHOL, TRIG, HDL, CHOLHDL, VLDL, LDLCALC, LDLDIRECT  Physical Exam:    VS:  BP 106/68 (BP Location: Left Arm, Patient Position: Sitting, Cuff Size: Large)   Pulse 71   Ht 6\' 2"  (1.88 m)   Wt 234 lb 6.4 oz (106.3 kg)   SpO2 98%   BMI 30.10 kg/m     Wt Readings from Last 3 Encounters:  05/15/22 234 lb 6.4 oz (106.3 kg)  05/10/22 235 lb (106.6 kg)  04/09/17 205 lb (93 kg)     GEN:  Well nourished, well developed in no acute distress HEENT: Normal NECK: No JVD; No carotid  bruits LYMPHATICS: No lymphadenopathy CARDIAC: RRR, no murmurs, rubs, gallops RESPIRATORY:  Clear to auscultation without rales, wheezing or rhonchi  ABDOMEN: Soft, non-tender, non-distended MUSCULOSKELETAL:  No edema; No deformity  SKIN: Warm and dry NEUROLOGIC:  Alert and oriented x 3 PSYCHIATRIC:  Normal affect   ASSESSMENT:    1. CAD in native artery   2. Essential hypertension   3. Hyperlipidemia, unspecified hyperlipidemia type   4. Tobacco use   5. Substance abuse (HCC)   6. Hypothyroidism, unspecified type    PLAN:    CAD: status post CABG (LIMA-LAD, SVG-RCA, SVG-OM1) on 07/2021.  Had NSTEMI 01/30/2022 with cath showing 95% LCx stenosis extending into left main, underwent PCI.  Echo 07/22/21 showed normal biventricular function, no significant valvular diseaes. -Continue aspirin 81 mg daily, Plavix 75 mg daily -Continue atorvastatin 80 mg daily -Continue metoprolol 75 mg twice daily -He is not sure what medicine occasions he has or is supposed to be taking.  We will schedule in pharmacy clinic and asked him to bring his meds with him to ensure he is taking meds correctly  Hypertension: Continue metoprolol 75 mg twice daily  Hyperlipidemia: Continue atorvastatin 80 mg daily and Zetia 10 mg daily.  LDL 78-9 23  Tobacco use: Smokes 1 ppd.  Patient counseled on the risk of tobacco use and cessation strongly recommended.  He is going to try nicotine gum to help him quit  Substance abuse: Recent UDS positive for cocaine, amphetamines, opiates.  Cessation strongly recommended  Hypokalemia: Potassium 3.4 on 05/11/2019 recheck BMET  Hypothyroidism: On levothyroxine 25 mcg reports has not been taking.  We will check TSH, free T4  He is self-pay.  Will ask our social worker to see him to discuss available resources   RTC in 3 months  Medication Adjustments/Labs and Tests Ordered: Current medicines are reviewed at length with the patient today.  Concerns regarding medicines are  outlined above.  Orders Placed This Encounter  Procedures   Referral to HRT/VAS Care Navigation   Meds ordered this encounter  Medications   nicotine polacrilex (NICORETTE) 4 MG gum    Sig: Take 1 each (4 mg total) by mouth as needed for smoking cessation.    Dispense:  100 tablet    Refill:  0    Patient Instructions  Medication Instructions:  Use nicorette as directed  *If you need a refill on your cardiac medications before your next appointment, please call your pharmacy*  Follow-Up: At [facility], you and your health needs are our priority.  As part of our  continuing mission to provide you with exceptional heart care, we have created designated Provider Care Teams.  These Care Teams include your primary Cardiologist (physician) and Advanced Practice Providers (APPs -  Physician Assistants and Nurse Practitioners) who all work together to provide you with the care you need, when you need it.  We recommend signing up for the patient portal called "MyChart".  Sign up information is provided on this After Visit Summary.  MyChart is used to connect with patients for Virtual Visits (Telemedicine).  Patients are able to view lab/test results, encounter notes, upcoming appointments, etc.  Non-urgent messages can be sent to your provider as well.   To learn more about what you can do with MyChart, go to [email or web address].    Your next appointment:   Next available with pharmacist BRING ALL MEDICATIONS  3 months with Dr. Bjorn Pippin   Important Information About Sugar         Signed, Little Ishikawa, MD  05/15/2022 5:44 PM    [facility]

## 2022-05-15 NOTE — Addendum Note (Signed)
Addended by: Patria Mane A on: 05/15/2022 05:49 PM   Modules accepted: Orders

## 2022-05-16 ENCOUNTER — Telehealth (HOSPITAL_COMMUNITY): Payer: Self-pay | Admitting: Licensed Clinical Social Worker

## 2022-05-16 NOTE — Telephone Encounter (Signed)
CSW received referral from [place] [facility] to assist patient with disability and financial assistance. Patient has no insurance, no PCP listed in the chart as well. CSW contacted patient but no answer and unable to leave a message. CSW will attempt again. Lasandra Beech, LCSW, CCSW-MCS [phone]

## 2022-05-21 ENCOUNTER — Telehealth: Payer: Self-pay | Admitting: Licensed Clinical Social Worker

## 2022-05-21 NOTE — Addendum Note (Signed)
Addended by: Derenda Fennel on: 05/21/2022 04:34 PM   Modules accepted: Orders

## 2022-05-21 NOTE — Telephone Encounter (Signed)
CSW attempted multiple times yesterday and today to reach patient with no success. Patient's voicemail has not been set up yet and unable to leave a message for return call. Raquel Sarna, [place], [place]

## 2022-05-22 ENCOUNTER — Telehealth: Payer: Self-pay | Admitting: Cardiology

## 2022-05-22 NOTE — Telephone Encounter (Signed)
Called to schedule PharmD appt for medication recommendations then a 3 month f/u with Dr. Bjorn Pippin. Patients VM not set up, called 3x on 3 different occassions. Will send letter to patient to call office.

## 2022-05-29 NOTE — Addendum Note (Signed)
Addended by: Derenda Fennel on: 05/29/2022 08:40 AM   Modules accepted: Orders

## 2022-06-01 IMAGING — CT CT HEAD W/O CM
4 series · 15 of 47 positions shown, 17 images · non-contrast
Comparison: None Available.

CLINICAL DATA: Neck trauma, intoxicated or obtunded (Age >= 16y);
Delirium. Altered mental status, delirium. Found down.



[Series 2: head w o · axial · 0.43mm/px · z∈[-12,+103]mm · 7 of 31 slices shown, 9 images]
[im 4/31  brain]
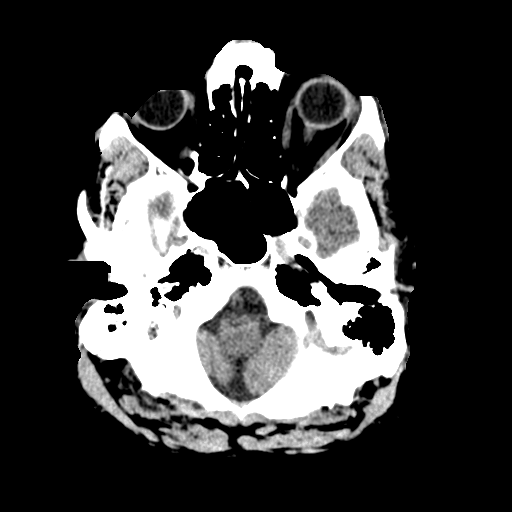
[im 4/31  bone]
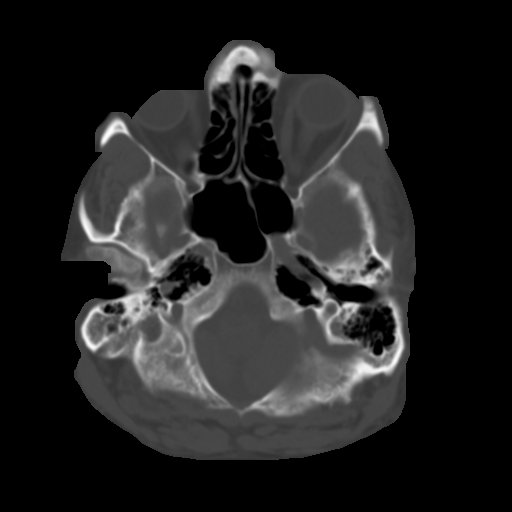
[im 8/31  brain]
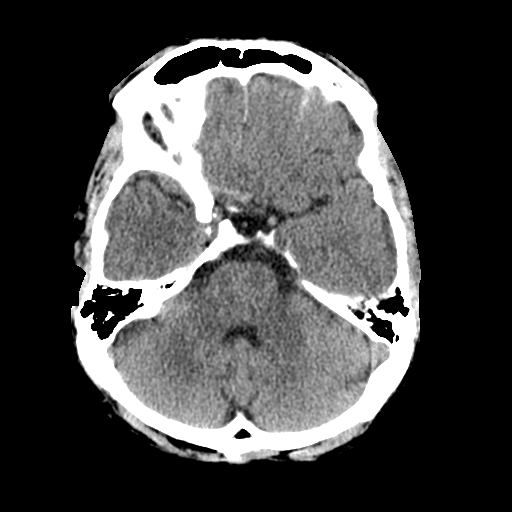
[im 12/31  brain]
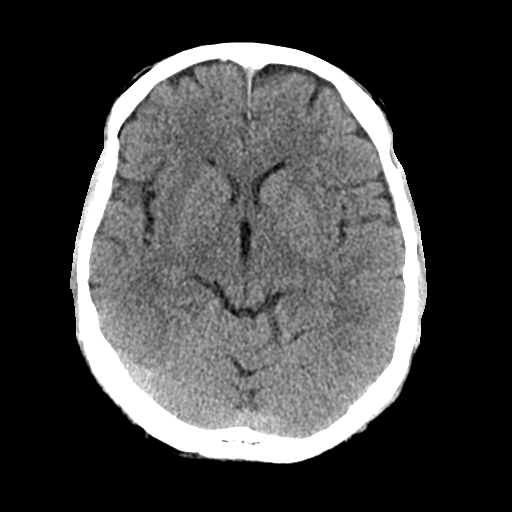
[im 16/31  brain]
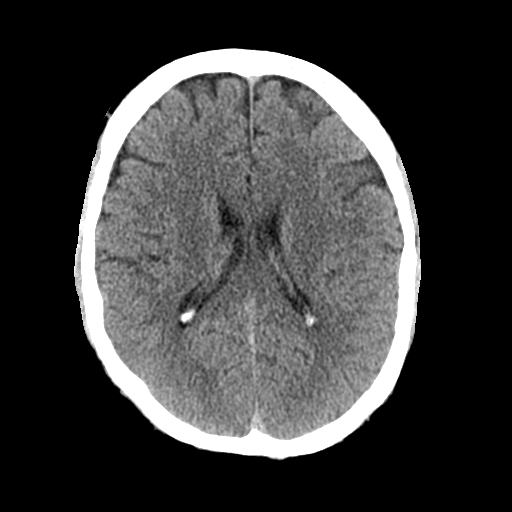
[im 19/31  brain]
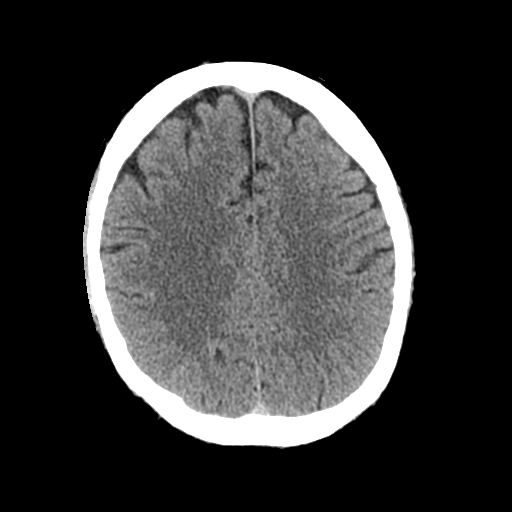
[im 19/31  bone]
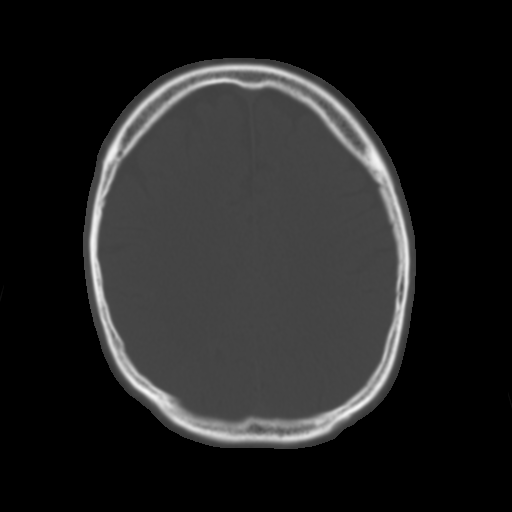
[im 23/31  brain]
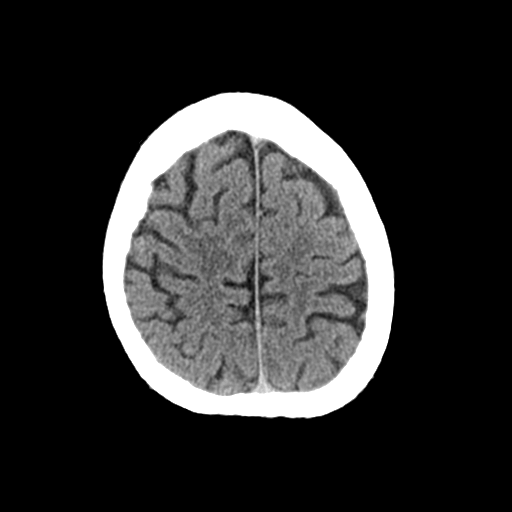
[im 27/31  brain]
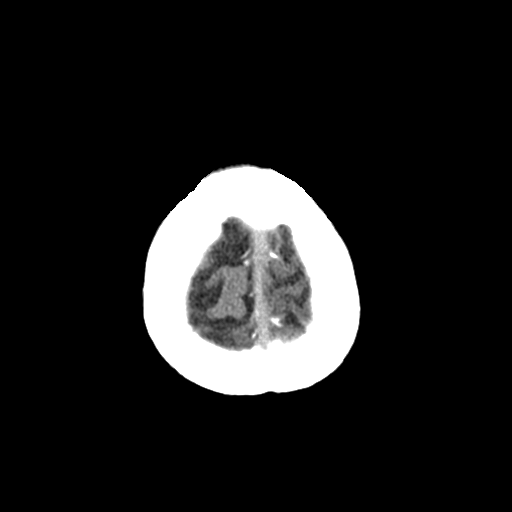

[Series 3: head bone · axial · 0.43mm/px · z∈[-13,+3]mm · 2 of 77 slices shown]
[im 8/77  bone]
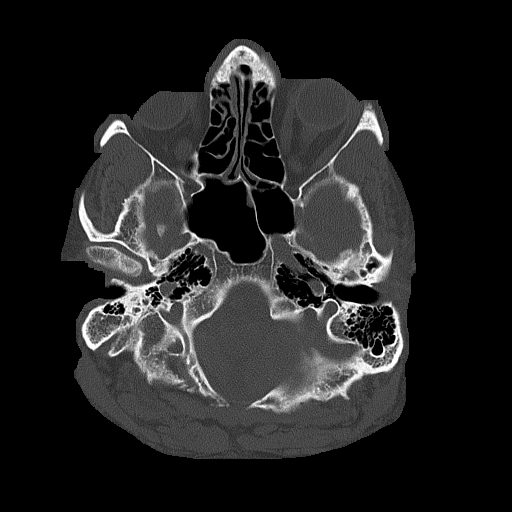
[im 16/77  bone]
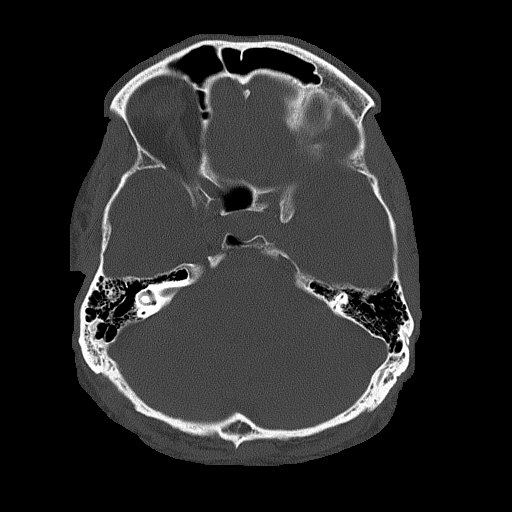

[Series 4: coronal soft · coronal · 0.34mm/px · 3 of 75 slices shown]
[im 25/75  brain]
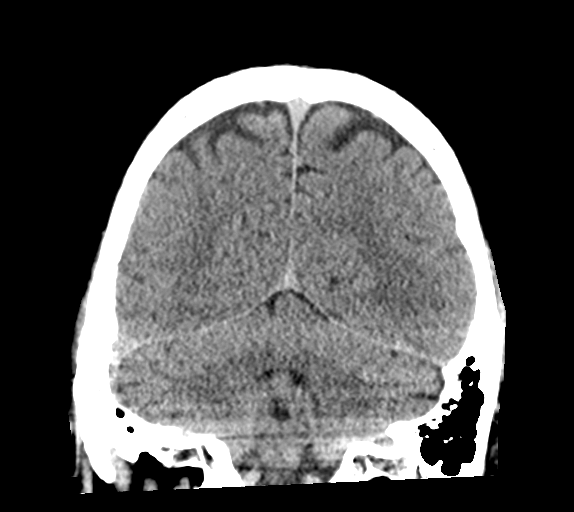
[im 33/75  brain]
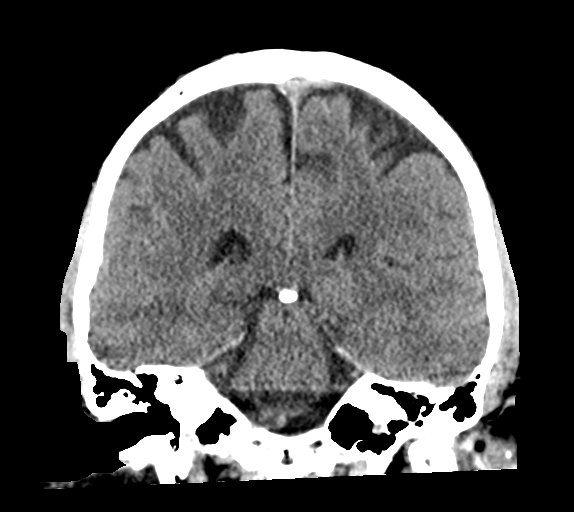
[im 42/75  brain]
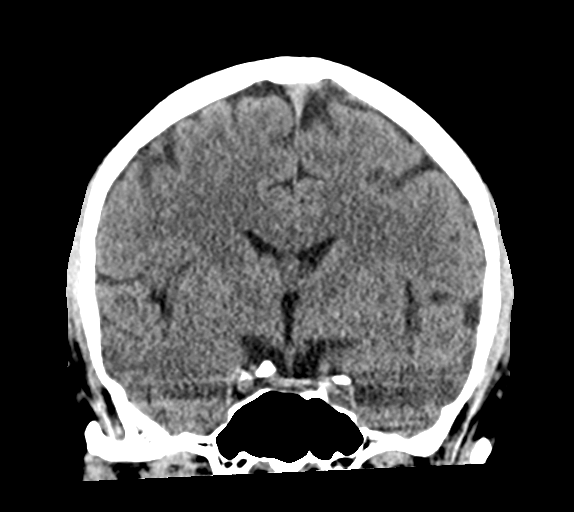

[Series 5: sagittal soft · sagittal · 0.33mm/px · 3 of 68 slices shown]
[im 23/68  brain]
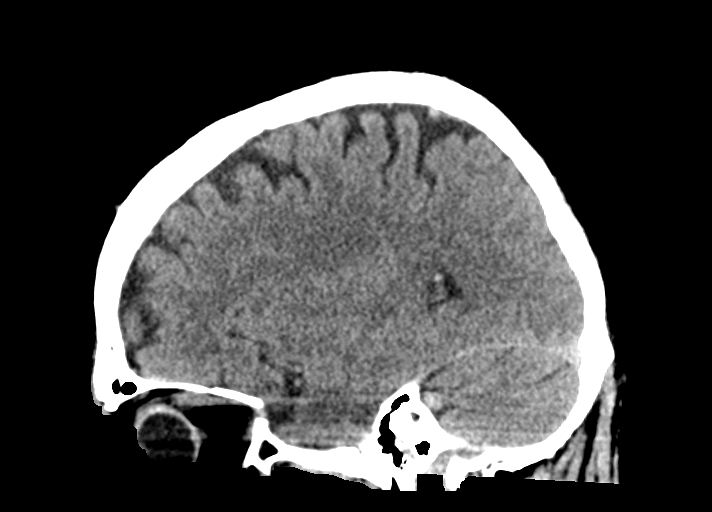
[im 34/68  brain]
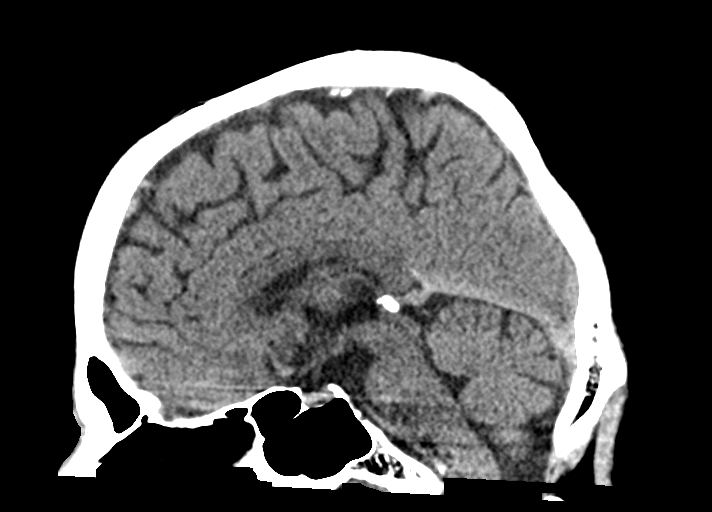
[im 45/68  brain]
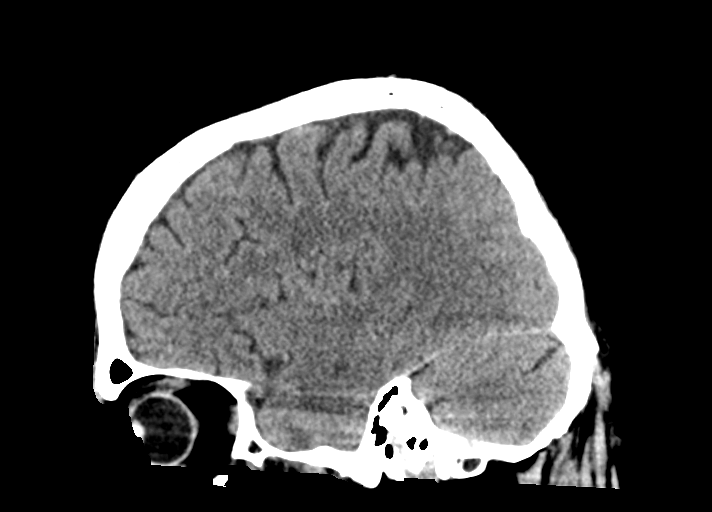

[15 of 47 positions shown; findings below may reference images not displayed]

FINDINGS: CT HEAD FINDINGS

Brain: Normal anatomic configuration. No abnormal intra or
extra-axial mass lesion or fluid collection. No abnormal mass effect
or midline shift. No evidence of acute intracranial hemorrhage or
infarct. Ventricular size is normal. Cerebellum unremarkable.

Vascular: Unremarkable

Skull: Intact

Sinuses/Orbits: Paranasal sinuses are clear. Orbits are
unremarkable.

Other: Mastoid air cells and middle ear cavities are clear.

CT CERVICAL SPINE FINDINGS

Alignment: There is mild reversal the normal cervical lordosis. No
listhesis.

Skull base and vertebrae: Craniocervical alignment is normal. The
atlantodental interval is not widened. There is a segmentation
anomaly with fusion of the C6 and C7 vertebral bodies and posterior
elements. No acute fracture of the cervical spine.

Soft tissues and spinal canal: No prevertebral fluid or swelling. No
visible canal hematoma.

Disc levels: There is intervertebral disc space narrowing and
endplate remodeling throughout the cervical spine, most severe at
C5-6 in keeping with changes of moderate degenerative disc disease.
Prevertebral soft tissues are not thickened. No significant canal
stenosis. Uncovertebral arthrosis results in moderate bilateral
neuroforaminal narrowing at C5-6. Mild bilateral neuroforaminal
narrowing noted at C3-4.

Upper chest: Unremarkable

Other: None
IMPRESSION: No acute intracranial injury.  No calvarial fracture.

No acute fracture or listhesis of the cervical spine.

## 2022-06-01 IMAGING — CT CT CERVICAL SPINE W/O CM
3 of 4 series · 11 of 33 positions shown, 13 images · non-contrast
Comparison: None Available.

CLINICAL DATA: Neck trauma, intoxicated or obtunded (Age >= 16y);
Delirium. Altered mental status, delirium. Found down.



[Series 5: sagittal bone · sagittal · 0.24mm/px · 5 of 61 slices shown, 6 images]
[im 21/61  bone]
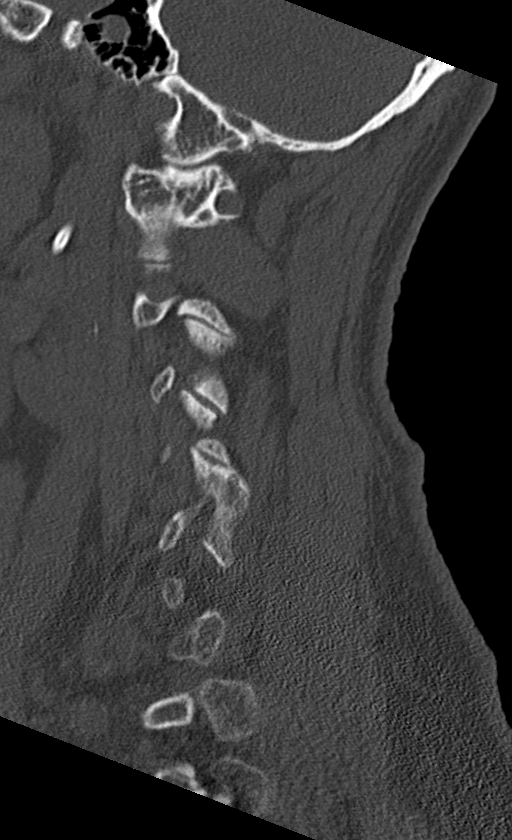
[im 26/61  bone]
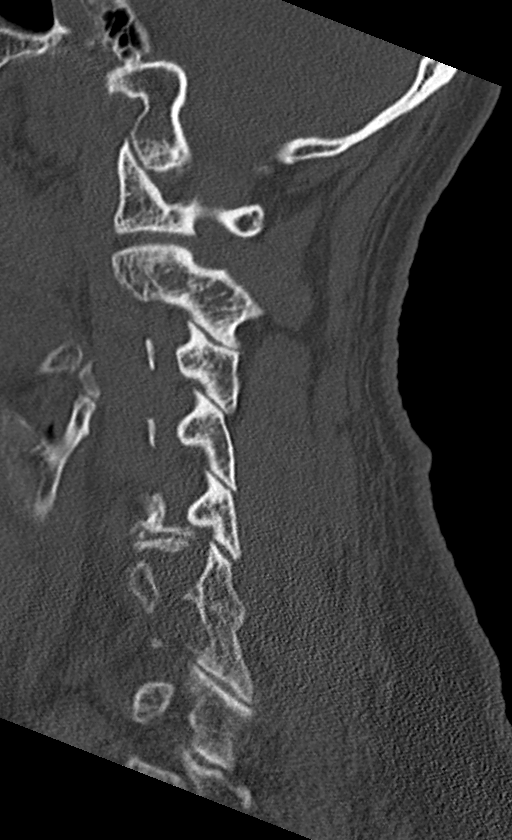
[im 31/61  soft-tissue]
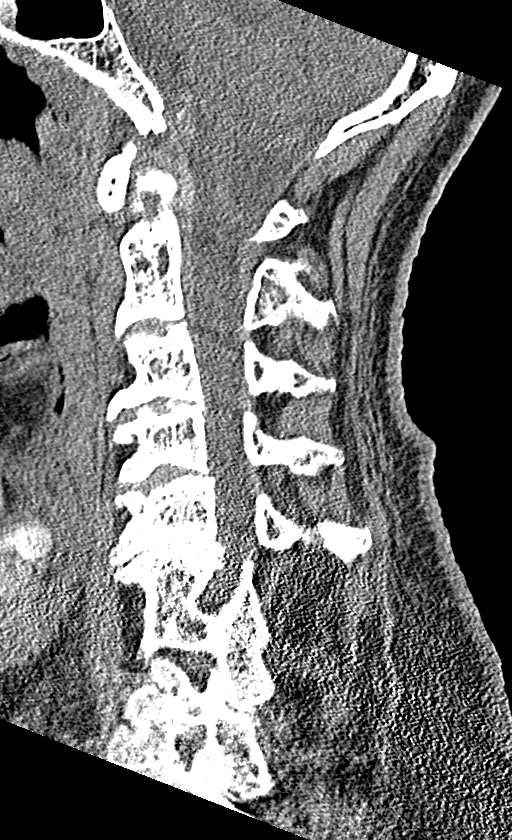
[im 31/61  bone]
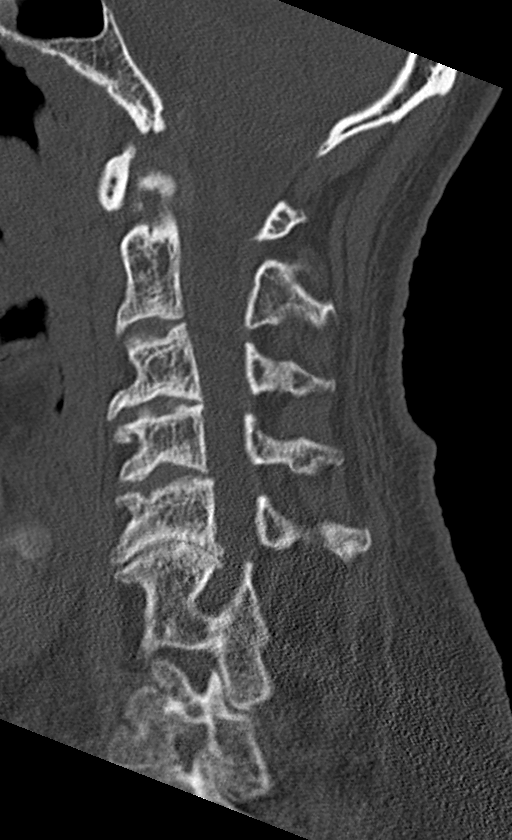
[im 36/61  bone]
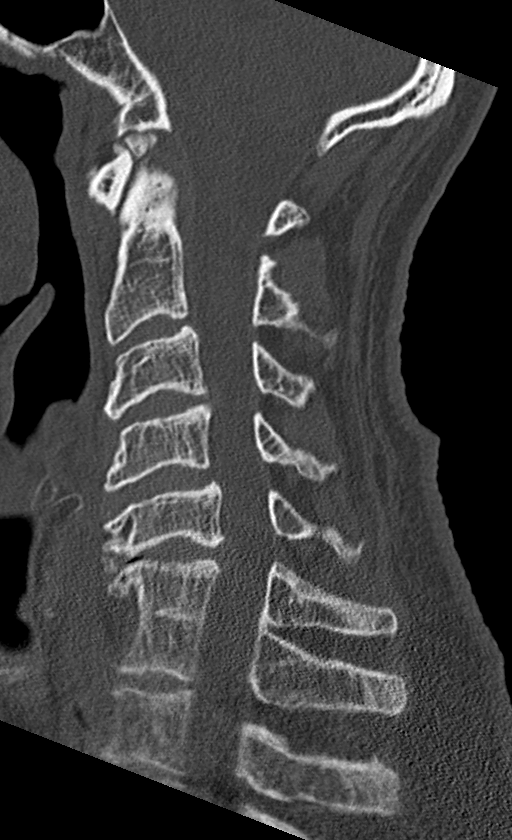
[im 41/61  bone]
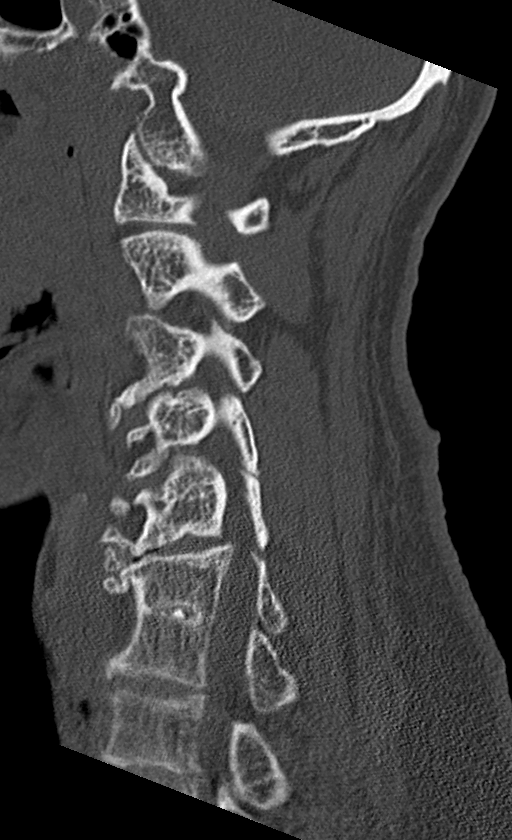

[Series 6: coronal bone · coronal · 0.27mm/px · 3 of 61 slices shown]
[im 13/61  bone]
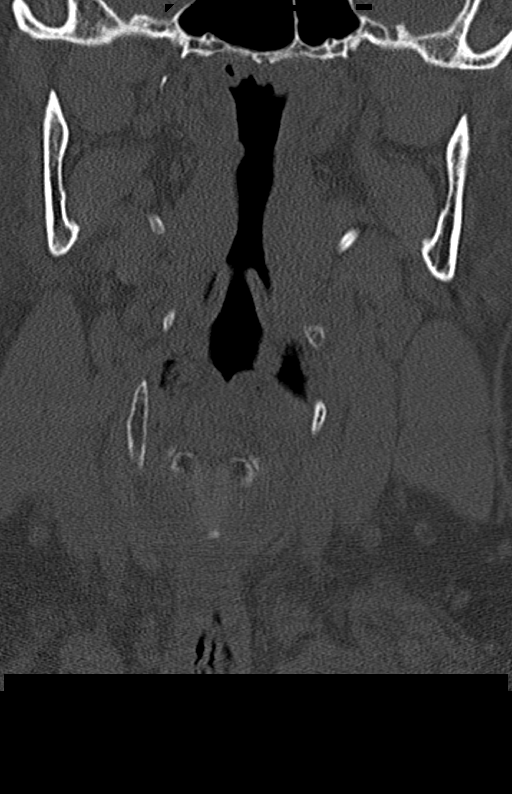
[im 25/61  bone]
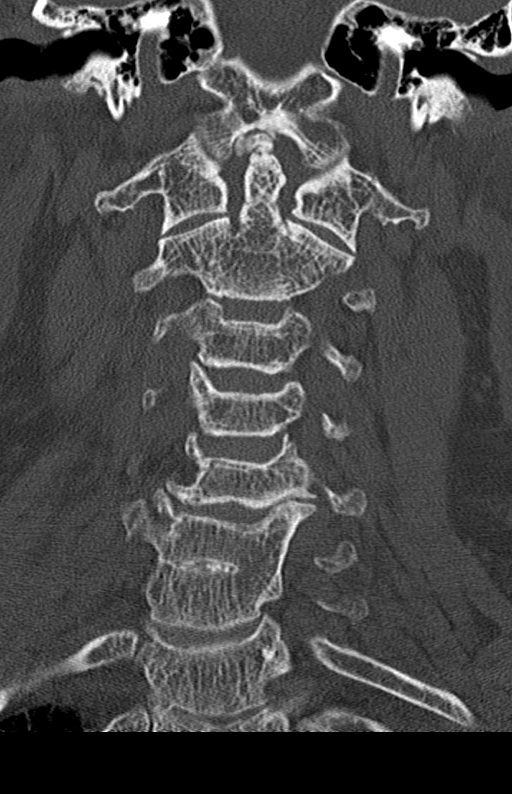
[im 37/61  bone]
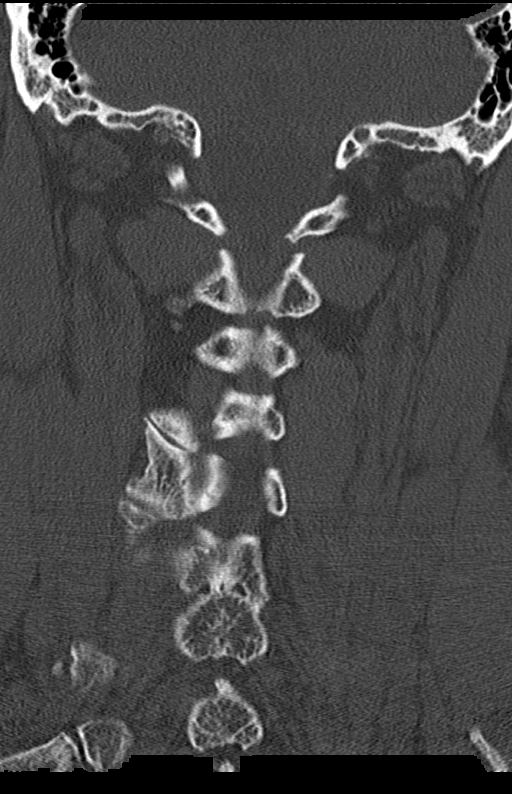

[Series 7: orthogonal axials · axial · 0.21mm/px · z∈[-172,-79]mm · 3 of 84 slices shown, 4 images]
[im 14/84  soft-tissue]
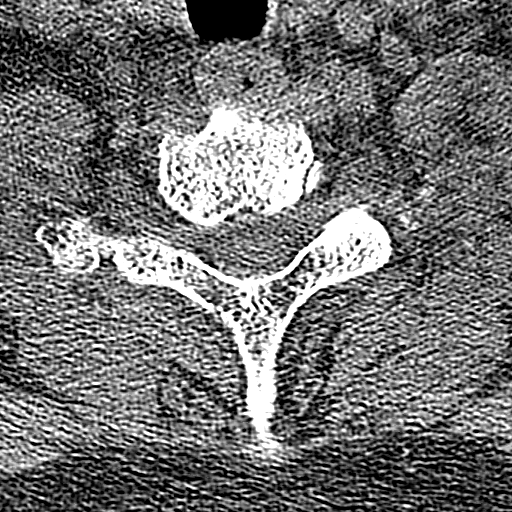
[im 14/84  bone]
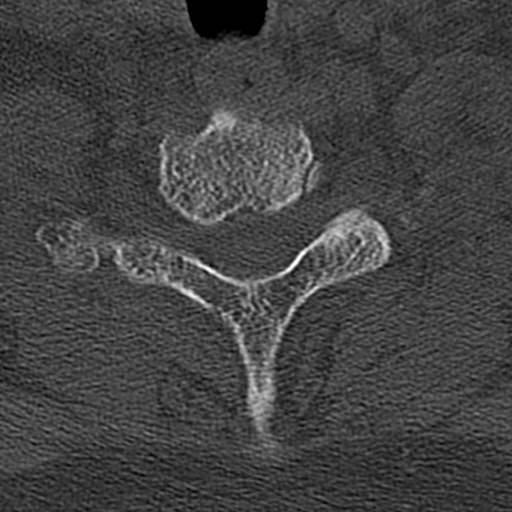
[im 42/84  bone]
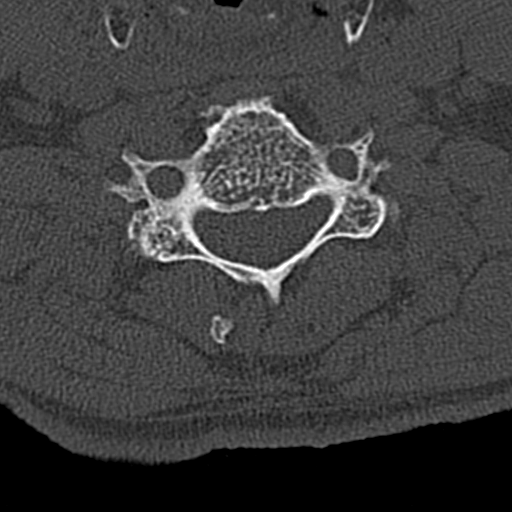
[im 70/84  bone]
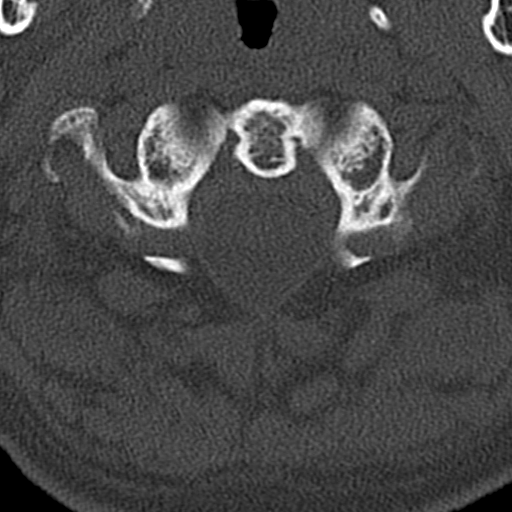

[11 of 33 positions shown; findings below may reference images not displayed]

FINDINGS: CT HEAD FINDINGS

Brain: Normal anatomic configuration. No abnormal intra or
extra-axial mass lesion or fluid collection. No abnormal mass effect
or midline shift. No evidence of acute intracranial hemorrhage or
infarct. Ventricular size is normal. Cerebellum unremarkable.

Vascular: Unremarkable

Skull: Intact

Sinuses/Orbits: Paranasal sinuses are clear. Orbits are
unremarkable.

Other: Mastoid air cells and middle ear cavities are clear.

CT CERVICAL SPINE FINDINGS

Alignment: There is mild reversal the normal cervical lordosis. No
listhesis.

Skull base and vertebrae: Craniocervical alignment is normal. The
atlantodental interval is not widened. There is a segmentation
anomaly with fusion of the C6 and C7 vertebral bodies and posterior
elements. No acute fracture of the cervical spine.

Soft tissues and spinal canal: No prevertebral fluid or swelling. No
visible canal hematoma.

Disc levels: There is intervertebral disc space narrowing and
endplate remodeling throughout the cervical spine, most severe at
C5-6 in keeping with changes of moderate degenerative disc disease.
Prevertebral soft tissues are not thickened. No significant canal
stenosis. Uncovertebral arthrosis results in moderate bilateral
neuroforaminal narrowing at C5-6. Mild bilateral neuroforaminal
narrowing noted at C3-4.

Upper chest: Unremarkable

Other: None
IMPRESSION: No acute intracranial injury.  No calvarial fracture.

No acute fracture or listhesis of the cervical spine.

## 2022-06-28 ENCOUNTER — Encounter (HOSPITAL_COMMUNITY): Payer: Self-pay | Admitting: Emergency Medicine

## 2022-06-28 ENCOUNTER — Emergency Department (HOSPITAL_COMMUNITY)
Admission: EM | Admit: 2022-06-28 | Discharge: 2022-06-28 | Discharge status: Against Medical Advice | Payer: Self-pay | Attending: Emergency Medicine | Admitting: Emergency Medicine

## 2022-06-28 ENCOUNTER — Other Ambulatory Visit: Payer: Self-pay

## 2022-06-28 ENCOUNTER — Emergency Department (HOSPITAL_COMMUNITY): Payer: Self-pay

## 2022-06-28 DIAGNOSIS — R4189 Other symptoms and signs involving cognitive functions and awareness: Secondary | ICD-10-CM

## 2022-06-28 DIAGNOSIS — R4020 Unspecified coma: Secondary | ICD-10-CM | POA: Insufficient documentation

## 2022-06-28 DIAGNOSIS — R404 Transient alteration of awareness: Secondary | ICD-10-CM

## 2022-06-28 DIAGNOSIS — K8020 Calculus of gallbladder without cholecystitis without obstruction: Secondary | ICD-10-CM | POA: Insufficient documentation

## 2022-06-28 DIAGNOSIS — Z955 Presence of coronary angioplasty implant and graft: Secondary | ICD-10-CM | POA: Insufficient documentation

## 2022-06-28 DIAGNOSIS — Z7982 Long term (current) use of aspirin: Secondary | ICD-10-CM | POA: Insufficient documentation

## 2022-06-28 DIAGNOSIS — Z79899 Other long term (current) drug therapy: Secondary | ICD-10-CM | POA: Insufficient documentation

## 2022-06-28 DIAGNOSIS — R079 Chest pain, unspecified: Secondary | ICD-10-CM | POA: Insufficient documentation

## 2022-06-28 DIAGNOSIS — R0602 Shortness of breath: Secondary | ICD-10-CM | POA: Insufficient documentation

## 2022-06-28 LAB — CBC WITH DIFFERENTIAL/PLATELET
Abs Immature Granulocytes: 0.05 K/uL (ref 0.00–0.07)
Basophils Absolute: 0.1 K/uL (ref 0.0–0.1)
Basophils Relative: 1 %
Eosinophils Absolute: 0.1 K/uL (ref 0.0–0.5)
Eosinophils Relative: 1 %
HCT: 50.9 % (ref 39.0–52.0)
Hemoglobin: 16.9 g/dL (ref 13.0–17.0)
Immature Granulocytes: 1 %
Lymphocytes Relative: 20 %
Lymphs Abs: 2 K/uL (ref 0.7–4.0)
MCH: 32.1 pg (ref 26.0–34.0)
MCHC: 33.2 g/dL (ref 30.0–36.0)
MCV: 96.6 fL (ref 80.0–100.0)
Monocytes Absolute: 0.9 K/uL (ref 0.1–1.0)
Monocytes Relative: 9 %
Neutro Abs: 6.9 K/uL (ref 1.7–7.7)
Neutrophils Relative %: 68 %
Platelets: 276 K/uL (ref 150–400)
RBC: 5.27 MIL/uL (ref 4.22–5.81)
RDW: 13.4 % (ref 11.5–15.5)
WBC: 10 K/uL (ref 4.0–10.5)
nRBC: 0 % (ref 0.0–0.2)

## 2022-06-28 LAB — URINALYSIS, ROUTINE W REFLEX MICROSCOPIC
Bilirubin Urine: NEGATIVE
Glucose, UA: NEGATIVE mg/dL
Ketones, ur: NEGATIVE mg/dL
Leukocytes,Ua: NEGATIVE
Nitrite: NEGATIVE
Protein, ur: 30 mg/dL — AB
Specific Gravity, Urine: 1.024 (ref 1.005–1.030)
pH: 5 (ref 5.0–8.0)

## 2022-06-28 LAB — I-STAT VENOUS BLOOD GAS, ED
Acid-base deficit: 2 mmol/L (ref 0.0–2.0)
Bicarbonate: 22.7 mmol/L (ref 20.0–28.0)
Calcium, Ion: 1.06 mmol/L — ABNORMAL LOW (ref 1.15–1.40)
HCT: 52 % (ref 39.0–52.0)
Hemoglobin: 17.7 g/dL — ABNORMAL HIGH (ref 13.0–17.0)
O2 Saturation: 100 %
Potassium: 3.6 mmol/L (ref 3.5–5.1)
Sodium: 139 mmol/L (ref 135–145)
TCO2: 24 mmol/L (ref 22–32)
pCO2, Ven: 38.8 mmHg — ABNORMAL LOW (ref 44–60)
pH, Ven: 7.376 (ref 7.25–7.43)
pO2, Ven: 203 mmHg — ABNORMAL HIGH (ref 32–45)

## 2022-06-28 LAB — TROPONIN I (HIGH SENSITIVITY)
Troponin I (High Sensitivity): 9 ng/L (ref <18)
Troponin I (High Sensitivity): 10 ng/L (ref <18)

## 2022-06-28 LAB — LACTIC ACID, PLASMA: Lactic Acid, Venous: 1.9 mmol/L (ref 0.5–1.9)

## 2022-06-28 LAB — RAPID URINE DRUG SCREEN, HOSP PERFORMED
Amphetamines: POSITIVE — AB
Barbiturates: NOT DETECTED
Benzodiazepines: NOT DETECTED
Cocaine: POSITIVE — AB
Opiates: NOT DETECTED
Tetrahydrocannabinol: NOT DETECTED

## 2022-06-28 LAB — COMPREHENSIVE METABOLIC PANEL
ALT: 20 U/L (ref 0–44)
AST: 21 U/L (ref 15–41)
Albumin: 4.4 g/dL (ref 3.5–5.0)
Alkaline Phosphatase: 82 U/L (ref 38–126)
Anion gap: 14 (ref 5–15)
BUN: 15 mg/dL (ref 6–20)
CO2: 19 mmol/L — ABNORMAL LOW (ref 22–32)
Calcium: 9.2 mg/dL (ref 8.9–10.3)
Chloride: 106 mmol/L (ref 98–111)
Creatinine, Ser: 1.35 mg/dL — ABNORMAL HIGH (ref 0.61–1.24)
GFR, Estimated: >60 mL/min (ref >60)
Glucose, Bld: 165 mg/dL — ABNORMAL HIGH (ref 70–99)
Potassium: 3.5 mmol/L (ref 3.5–5.1)
Sodium: 139 mmol/L (ref 135–145)
Total Bilirubin: 0.7 mg/dL (ref 0.3–1.2)
Total Protein: 7.7 g/dL (ref 6.5–8.1)

## 2022-06-28 LAB — ETHANOL: Alcohol, Ethyl (B): <10 mg/dL

## 2022-06-28 LAB — I-STAT CHEM 8, ED
BUN: 17 mg/dL (ref 6–20)
Calcium, Ion: 1.06 mmol/L — ABNORMAL LOW (ref 1.15–1.40)
Chloride: 106 mmol/L (ref 98–111)
Creatinine, Ser: 1.3 mg/dL — ABNORMAL HIGH (ref 0.61–1.24)
Glucose, Bld: 169 mg/dL — ABNORMAL HIGH (ref 70–99)
HCT: 53 % — ABNORMAL HIGH (ref 39.0–52.0)
Hemoglobin: 18 g/dL — ABNORMAL HIGH (ref 13.0–17.0)
Potassium: 3.4 mmol/L — ABNORMAL LOW (ref 3.5–5.1)
Sodium: 139 mmol/L (ref 135–145)
TCO2: 20 mmol/L — ABNORMAL LOW (ref 22–32)

## 2022-06-28 LAB — AMMONIA: Ammonia: 44 umol/L — ABNORMAL HIGH (ref 9–35)

## 2022-06-28 LAB — CBG MONITORING, ED: Glucose-Capillary: 155 mg/dL — ABNORMAL HIGH (ref 70–99)

## 2022-06-28 MED ORDER — IOHEXOL 350 MG/ML SOLN
100.0000 mL | Freq: Once | INTRAVENOUS | Status: AC | PRN
  Administered 2022-06-28: 100 mL via INTRAVENOUS

## 2022-06-28 MED ORDER — SODIUM CHLORIDE 0.9 % IV SOLN: INTRAVENOUS | Status: DC

## 2022-06-28 MED ORDER — SODIUM CHLORIDE 0.9 % IV BOLUS
1000.0000 mL | Freq: Once | INTRAVENOUS | Status: AC
Start: 2022-06-28 — End: 2022-06-28
  Administered 2022-06-28: 1000 mL via INTRAVENOUS

## 2022-06-28 MED ORDER — SODIUM CHLORIDE 0.9 % IV BOLUS
1000.0000 mL | Freq: Once | INTRAVENOUS | Status: AC
  Administered 2022-06-28: 1000 mL via INTRAVENOUS

## 2022-06-28 MED ORDER — NALOXONE HCL 4 MG/0.1ML NA LIQD: NASAL | 1 refills | Status: AC

## 2022-06-28 NOTE — Progress Notes (Signed)
ABG attempted x 2. First sample insufficient;second clotted and unable to produce results. MD notified and Dc'd for now.

## 2022-06-28 NOTE — ED Triage Notes (Signed)
Pt bib [facility] as post cpr. Pt was found unresponsive and cpr was indicated by family members for 10 minutes. Fire rescue got to scene and continued cpr until ems arrived. Upon ems arrival pt had pulses and organized rhythm. Ventilations assisted with bvm. En route, no pulses were palpable, 2 compressions done by lucas and pt woke up and pulled king airway out. Endorses cocaine use. No meds given PTA.

## 2022-06-28 NOTE — ED Provider Notes (Addendum)
[facility]MENT Provider Note   CSN: [id number] Arrival date & time: 06/28/22  0005     History  Chief Complaint  Patient presents with   Post CPR    [facility] is a 56 y.o. male.  56 year old male presents the ER as post CPR.  History obtained from paramedics at bedside and patient as well.  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  Cannot take his medications yet.  He states that he went to go out to eat.  He states that he smoked some crack off a can and is turning black which is apparently abnormal and he thinks that maybe not all of it was cracked.  Has lastingly remembers.  EMS states that the family said he is having some shortness of breath and chest pain and then lost consciousness.  They were at a gas station and got a lot of cardia and no pulses was not breathing so they.  They are unsure if he had a potential..  Fire arrived and continued chest compressions by time paramedics arrived he had a pulse back and had an organized rhythm around 90.  His initial blood pressure was 180/90 but still unresponsive and requiring assisted ventilations.  In route to here patient regained consciousness and pulled out the [facility] airway has been talking since that time.  No complaints at this time besides being hot.  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  Ex wife at bedside who was with him when this happened and states that the patient had smoked crack and shortly afterwards got sweaty pale and was having very shallow respirations.  An episode where his eyes flutter back-and-forth and he was somewhat responsive unresponsive she has when she pulled over.  He stated that he could not see his breathing so they started chest compressions but are unsure if he had a pulse or not.        Home Medications Prior to Admission medications   Medication Sig Start  Date End Date Taking? Authorizing Provider  naloxone [facility]) nasal spray 4 mg/0.1 mL Use per directions on box 06/28/22  Yes Jakira Mcfadden, Barbara Cower, MD  albuterol (VENTOLIN HFA) 108 (90 Base) MCG/ACT inhaler Inhale into the lungs. Patient not taking: Reported on 05/15/2022 10/12/21   [provider]  aspirin 81 MG chewable tablet Chew by mouth.    [provider]  aspirin EC 81 MG tablet Take 1 tablet by mouth daily. 10/12/21   [provider]  atorvastatin (LIPITOR) 80 MG tablet Take by mouth. 10/12/21   [provider]  calcium carbonate (OS-CAL) 1250 (500 Ca) MG chewable tablet Chew by mouth. 08/12/21   [provider]  calcium carbonate (OS-CAL) 1250 (500 Ca) MG chewable tablet Chew by mouth.    [provider]  clobetasol cream (TEMOVATE) 0.05 % Apply topically 2 (two) times daily. 08/12/21   [provider]  clopidogrel (PLAVIX) 75 MG tablet Take 1 tablet (75 mg total) by mouth daily. 05/10/22   Zadie Rhine, MD  ezetimibe (ZETIA) 10 MG tablet Take by mouth. 08/12/21   [provider]  famotidine (PEPCID) 20 MG tablet Take by mouth. 01/27/22   [provider]  Fluticasone Furoate (ARNUITY ELLIPTA) 100 MCG/ACT AEPB Inhale into the lungs. 10/25/21   [provider]  gabapentin (NEURONTIN) 100 MG  capsule Take by mouth. 01/27/22   [provider]  levothyroxine (SYNTHROID) 25 MCG tablet Take by mouth. 10/12/21   [provider]  magnesium oxide (MAG-OX) 400 MG tablet Take by mouth. 08/12/21   [provider]  Menthol-Methyl Salicylate (THERA-GESIC) 0.5-15 % CREA Apply topically. 08/12/21   [provider]  metoprolol tartrate (LOPRESSOR) 50 MG tablet Take by mouth.    [provider]  Metoprolol Tartrate 75 MG TABS Take by mouth. 01/01/22 01/01/23  [provider]  nicotine polacrilex (NICORETTE) 4 MG gum Take 1 each (4 mg total) by mouth as needed for smoking cessation.  05/15/22   Little Ishikawa, MD  nitroGLYCERIN (NITROSTAT) 0.4 MG SL tablet Place under the tongue. Patient not taking: Reported on 05/15/2022 08/12/21 02/01/23  [provider]  polyethylene glycol (MIRALAX / GLYCOLAX) packet Take 17 g by mouth 2 (two) times daily. Patient not taking: Reported on 05/15/2022 03/19/17   Kathlen Mody, MD  senna-docusate (SENOKOT-S) 8.6-50 MG tablet Take 2 tablets by mouth 2 (two) times daily. Patient not taking: Reported on 05/15/2022 03/19/17   Kathlen Mody, MD  sodium chloride (OCEAN) 0.65 % nasal spray Use 2 sprays into each nostril four (4) times a day as needed for congestion (dry nasal). Patient not taking: Reported on 05/15/2022 08/12/21   [provider]      Allergies    Patient has no known allergies.    Review of Systems   Review of Systems  Physical Exam Updated Vital Signs BP 116/75 (BP Location: Right Wrist)   Pulse 67   Temp 97.7 F (36.5 C) (Oral)   Resp 16   SpO2 95%  Physical Exam Vitals and nursing note reviewed.  Constitutional:      Appearance: He is well-developed.  HENT:     Head: Normocephalic and atraumatic.     Mouth/Throat:     Mouth: Mucous membranes are dry.  Eyes:     Comments: Pupils around 3 mm and nonreactive, EOM intact  Cardiovascular:     Rate and Rhythm: Normal rate.  Pulmonary:     Effort: Pulmonary effort is normal. No respiratory distress.  Abdominal:     General: There is no distension.  Musculoskeletal:        General: Normal range of motion.     Cervical back: Normal range of motion.  Skin:    Comments: Cool and diaphoretic, pale  Neurological:     Mental Status: He is alert.     ED Results / Procedures / Treatments   Labs (all labs ordered are listed, but only abnormal results are displayed) Labs Reviewed  COMPREHENSIVE METABOLIC PANEL - Abnormal; Notable for the following components:      Result Value   CO2 19 (*)    Glucose, Bld 165 (*)    Creatinine, Ser 1.35 (*)     All other components within normal limits  URINALYSIS, ROUTINE W REFLEX MICROSCOPIC - Abnormal; Notable for the following components:   APPearance HAZY (*)    Hgb urine dipstick SMALL (*)    Protein, ur 30 (*)    Bacteria, UA RARE (*)    All other components within normal limits  AMMONIA - Abnormal; Notable for the following components:   Ammonia 44 (*)    All other components within normal limits  RAPID URINE DRUG SCREEN, HOSP PERFORMED - Abnormal; Notable for the following components:   Cocaine POSITIVE (*)    Amphetamines POSITIVE (*)    All  other components within normal limits  CBG MONITORING, ED - Abnormal; Notable for the following components:   Glucose-Capillary 155 (*)    All other components within normal limits  I-STAT CHEM 8, ED - Abnormal; Notable for the following components:   Potassium 3.4 (*)    Creatinine, Ser 1.30 (*)    Glucose, Bld 169 (*)    Calcium, Ion 1.06 (*)    TCO2 20 (*)    Hemoglobin 18.0 (*)    HCT 53.0 (*)    All other components within normal limits  I-STAT VENOUS BLOOD GAS, ED - Abnormal; Notable for the following components:   pCO2, Ven 38.8 (*)    pO2, Ven 203 (*)    Calcium, Ion 1.06 (*)    Hemoglobin 17.7 (*)    All other components within normal limits  CULTURE, BLOOD (ROUTINE X 2)  CULTURE, BLOOD (ROUTINE X 2)  LACTIC ACID, PLASMA  CBC WITH DIFFERENTIAL/PLATELET  ETHANOL  I-STAT ARTERIAL BLOOD GAS, ED  CBG MONITORING, ED  TROPONIN I (HIGH SENSITIVITY)  TROPONIN I (HIGH SENSITIVITY)    EKG EKG Interpretation  Date/Time:  Saturday June 28 2022 00:07:30 EDT Ventricular Rate:  117 PR Interval:  153 QRS Duration: 117 QT Interval:  354 QTC Calculation: 494 R Axis:   15 Text Interpretation: Sinus tachycardia Probable left atrial enlargement Right bundle branch block Inferior infarct, old Baseline wander in lead(s) II III aVF Confirmed by Marily Memos [id number]) on 06/28/2022 12:48:11 AM  Radiology CT Angio Chest/Abd/Pel for  Dissection W and/or Wo Contrast  Result Date: 06/28/2022 CLINICAL DATA:  Acute aortic syndrome suspected EXAM: CT ANGIOGRAPHY CHEST, ABDOMEN AND PELVIS TECHNIQUE: Non-contrast CT of the chest was initially obtained. Multidetector CT imaging through the chest, abdomen and pelvis was performed using the standard protocol during bolus administration of intravenous contrast. Multiplanar reconstructed images and MIPs were obtained and reviewed to evaluate the vascular anatomy. RADIATION DOSE REDUCTION: This exam was performed according to the departmental dose-optimization program which includes automated exposure control, adjustment of the mA and/or kV according to patient size and/or use of iterative reconstruction technique. CONTRAST:  OMNIPAQUE IOHEXOL 350 MG/ML SOLN COMPARISON:  None Available. FINDINGS: CTA CHEST FINDINGS Cardiovascular: Preferential opacification of the thoracic aorta. No evidence of thoracic aortic aneurysm or dissection. Normal heart size. No pericardial effusion. No pulmonary artery filling defects. Atheromatous calcification of the coronaries. There has been CABG and coronary stenting. Mediastinum/Nodes: No hematoma or pneumomediastinum. Granulomatous calcifications of left lower lobar nodes Lungs/Pleura: Airway thickening diffusely. Dependent atelectasis. Calcified granulomas in the left lower lobe. Musculoskeletal: Healed sternotomy.  No acute or aggressive finding Review of the MIP images confirms the above findings. CTA ABDOMEN AND PELVIS FINDINGS VASCULAR Aorta: Atheromatous plaque.  No aneurysm or dissection Celiac: 40% narrowing at the origin with shape suggesting this is primarily from the median arcuate ligament. SMA: Atheromatous type narrowing of the proximal SMA measuring 50% based on axial images. No branch occlusion or beading. Renals: Accessory and low renal arteries. Atheromatous plaque at the lower left renal artery ostium causing moderate stenosis. IMA: Patent Inflow:  Atheromatous plaque Veins: Negative Review of the MIP images confirms the above findings. NON-VASCULAR Hepatobiliary: No focal liver abnormality.Cholelithiasis. Pancreas: Unremarkable. Spleen: Unremarkable. Adrenals/Urinary Tract: Negative adrenals. No hydronephrosis or stone. Horseshoe kidney. Unremarkable bladder. Stomach/Bowel:  No obstruction. No visible bowel inflammation. Lymphatic: No mass or adenopathy. Reproductive:No pathologic findings. Other: No ascites or pneumoperitoneum. Musculoskeletal: No acute abnormalities. Review of the MIP images confirms the above findings.  IMPRESSION: 1. No acute finding.  Negative for acute aortic syndrome. 2. 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 4. Cholelithiasis. Electronically Signed   By: Tiburcio Pea M.D.   On: 06/28/2022 05:27   CT HEAD WO CONTRAST  Result Date: 06/28/2022 CLINICAL DATA:  No MR loss.  Altered mental status. EXAM: CT HEAD WITHOUT CONTRAST TECHNIQUE: Contiguous axial images were obtained from the base of the skull through the vertex without intravenous contrast. RADIATION DOSE REDUCTION: This exam was performed according to the departmental dose-optimization program which includes automated exposure control, adjustment of the mA and/or kV according to patient size and/or use of iterative reconstruction technique. COMPARISON:  05/10/2022 FINDINGS: Brain: No intracranial hemorrhage, mass effect, or midline shift. No hydrocephalus. The basilar cisterns are patent. No evidence of territorial infarct or acute ischemia. No extra-axial or intracranial fluid collection. Vascular: No hyperdense vessel or unexpected calcification. Skull: No fracture or focal lesion. Sinuses/Orbits: Trace mucosal thickening of paranasal sinuses. No fluid levels. Mastoid air cells are clear. No orbital abnormalities. Other: None. IMPRESSION: No acute intracranial abnormality. Electronically Signed   By: Narda Rutherford M.D.   On:  06/28/2022 01:45   DG Chest Port 1 View  Result Date: 06/28/2022 CLINICAL DATA:  Status post CPR EXAM: PORTABLE CHEST 1 VIEW COMPARISON:  05/10/2022 FINDINGS: Cardiac shadow is within normal limits. Postsurgical changes are noted. Lungs are clear bilaterally. No acute bony abnormality is noted. IMPRESSION: No acute abnormality noted. Electronically Signed   By: Alcide Clever M.D.   On: 06/28/2022 00:49    Procedures Procedures    Medications Ordered in ED Medications  sodium chloride 0.9 % bolus 1,000 mL (0 mLs Intravenous Stopped 06/28/22 0229)  sodium chloride 0.9 % bolus 1,000 mL (0 mLs Intravenous Stopped 06/28/22 0456)  iohexol (OMNIPAQUE) 350 MG/ML injection 100 mL (100 mLs Intravenous Contrast Given 06/28/22 0503)    ED Course/ Medical Decision Making/ A&P                           Medical Decision Making Amount and/or Complexity of Data Reviewed Labs: ordered. Radiology: ordered.  Risk Prescription drug management.   Initial concern for possible cardiac event versus seizure versus PE versus aortic dissection.  Patient's labs returned normal.  Patient had slow improvement over time.  Dissection study was also personally viewed by myself interpreted as no dissection or obvious PE or cardiac issues.  Does have a 50% occluded SMA noted by radiology.  Patient maintained on monitor for over 6 hours, color returned and responsive the whole time.  After the new history from his ex-wife and from the patient I suspect that he probably overdosed on fentanyl.  Patient thinks this is well.  I gust with cardiology and they felt that a cardiac event was unlikely but suggested echocardiogram and monitoring.  I also consider possible seizure and suggested the patient that we hobs him for further cardiac monitoring, echocardiogram and EEG.  Patient feels confident that this was an overdose rather than any other type of event.  Admittedly it does fit with that timeline was in the fact that was never  confirmed that he had lost pulses but did have significant respiratory depression.  Ultimately patient was discharged AMA. He was awake, alert and competent to make the decision. Ex-wife who is his 'best friend' tried to convince him to stay however patient doesn't want to stay.    Final Clinical Impression(s) /  ED Diagnoses Final diagnoses:  Unresponsive episode    Rx / DC Orders ED Discharge Orders          Ordered    naloxone [facility](NARCAN) nasal spray 4 mg/0.1 mL        06/28/22 0634              Jamill Wetmore, Barbara CowerJason, MD 06/28/22 [id number]    Marily MemosMesner, Palmer Fahrner, MD 06/28/22 [id number]

## 2022-06-28 NOTE — ED Notes (Signed)
Patient transported to CT with RN 

## 2022-07-02 ENCOUNTER — Other Ambulatory Visit: Payer: Self-pay

## 2022-07-02 ENCOUNTER — Emergency Department (HOSPITAL_COMMUNITY)
Admission: EM | Admit: 2022-07-02 | Discharge: 2022-07-02 | Discharge status: Against Medical Advice | Payer: Self-pay | Attending: Emergency Medicine | Admitting: Emergency Medicine

## 2022-07-02 ENCOUNTER — Encounter (HOSPITAL_COMMUNITY): Payer: Self-pay | Admitting: *Deleted

## 2022-07-02 DIAGNOSIS — R112 Nausea with vomiting, unspecified: Secondary | ICD-10-CM | POA: Insufficient documentation

## 2022-07-02 DIAGNOSIS — K5900 Constipation, unspecified: Secondary | ICD-10-CM | POA: Insufficient documentation

## 2022-07-02 DIAGNOSIS — Z5321 Procedure and treatment not carried out due to patient leaving prior to being seen by health care provider: Secondary | ICD-10-CM | POA: Insufficient documentation

## 2022-07-02 LAB — CBC
HCT: 56.2 % — ABNORMAL HIGH (ref 39.0–52.0)
Hemoglobin: 19.3 g/dL — ABNORMAL HIGH (ref 13.0–17.0)
MCH: 32.5 pg (ref 26.0–34.0)
MCHC: 34.3 g/dL (ref 30.0–36.0)
MCV: 94.8 fL (ref 80.0–100.0)
Platelets: 311 10*3/uL (ref 150–400)
RBC: 5.93 MIL/uL — ABNORMAL HIGH (ref 4.22–5.81)
RDW: 13.6 % (ref 11.5–15.5)
WBC: 9.5 10*3/uL (ref 4.0–10.5)
nRBC: 0 % (ref 0.0–0.2)

## 2022-07-02 LAB — COMPREHENSIVE METABOLIC PANEL
ALT: 23 U/L (ref 0–44)
AST: 22 U/L (ref 15–41)
Albumin: 4.6 g/dL (ref 3.5–5.0)
Alkaline Phosphatase: 101 U/L (ref 38–126)
Anion gap: 11 (ref 5–15)
BUN: 16 mg/dL (ref 6–20)
CO2: 26 mmol/L (ref 22–32)
Calcium: 10.4 mg/dL — ABNORMAL HIGH (ref 8.9–10.3)
Chloride: 100 mmol/L (ref 98–111)
Creatinine, Ser: 1.16 mg/dL (ref 0.61–1.24)
GFR, Estimated: >60 mL/min (ref >60)
Glucose, Bld: 117 mg/dL — ABNORMAL HIGH (ref 70–99)
Potassium: 4.2 mmol/L (ref 3.5–5.1)
Sodium: 137 mmol/L (ref 135–145)
Total Bilirubin: 1.3 mg/dL — ABNORMAL HIGH (ref 0.3–1.2)
Total Protein: 8.5 g/dL — ABNORMAL HIGH (ref 6.5–8.1)

## 2022-07-02 LAB — LIPASE, BLOOD: Lipase: 25 U/L (ref 11–51)

## 2022-07-02 NOTE — ED Notes (Signed)
Pt called three times with no answer- pt is not in lobby

## 2022-07-02 NOTE — ED Triage Notes (Signed)
Pt in c/o abd pain and constipation, reports small BM today, pt reports this is a chronic issue, c/o nausea, reports vomiting x 3 in the last 24 hrs onset this am, A&O x4

## 2022-07-03 LAB — CULTURE, BLOOD (ROUTINE X 2)
Culture: NO GROWTH
Culture: NO GROWTH
Special Requests: ADEQUATE
Special Requests: ADEQUATE
